# Patient Record
Sex: Female | Born: 1945 | Hispanic: No | Smoking: Never smoker
Health system: Southern US, Community
[De-identification: ages and names within clinical notes are randomized; demographics above are authoritative.]

## PROBLEM LIST (undated history)

## (undated) DIAGNOSIS — I671 Cerebral aneurysm, nonruptured: Secondary | ICD-10-CM

## (undated) DIAGNOSIS — R413 Other amnesia: Secondary | ICD-10-CM

## (undated) DIAGNOSIS — R569 Unspecified convulsions: Secondary | ICD-10-CM

## (undated) DIAGNOSIS — K5190 Ulcerative colitis, unspecified, without complications: Secondary | ICD-10-CM

## (undated) DIAGNOSIS — G912 (Idiopathic) normal pressure hydrocephalus: Secondary | ICD-10-CM

## (undated) HISTORY — DX: Other amnesia: R41.3

## (undated) HISTORY — PX: BRAIN SURGERY: SHX531

## (undated) HISTORY — DX: Unspecified convulsions: R56.9

---

## 1999-04-06 ENCOUNTER — Encounter: Payer: Self-pay | Admitting: Obstetrics and Gynecology

## 1999-04-06 ENCOUNTER — Encounter: Admission: RE | Admit: 1999-04-06 | Discharge: 1999-04-06 | Payer: Self-pay | Admitting: Obstetrics and Gynecology

## 2000-01-10 ENCOUNTER — Ambulatory Visit (HOSPITAL_COMMUNITY): Admission: RE | Admit: 2000-01-10 | Discharge: 2000-01-10 | Payer: Self-pay | Admitting: Gastroenterology

## 2000-04-07 ENCOUNTER — Encounter: Admission: RE | Admit: 2000-04-07 | Discharge: 2000-04-07 | Payer: Self-pay | Admitting: Obstetrics and Gynecology

## 2000-04-07 ENCOUNTER — Encounter: Payer: Self-pay | Admitting: Obstetrics and Gynecology

## 2001-07-01 ENCOUNTER — Encounter: Admission: RE | Admit: 2001-07-01 | Discharge: 2001-07-01 | Payer: Self-pay | Admitting: Obstetrics and Gynecology

## 2001-07-01 ENCOUNTER — Encounter: Payer: Self-pay | Admitting: Obstetrics and Gynecology

## 2002-01-05 ENCOUNTER — Encounter: Payer: Self-pay | Admitting: Internal Medicine

## 2002-01-05 ENCOUNTER — Encounter: Admission: RE | Admit: 2002-01-05 | Discharge: 2002-01-05 | Payer: Self-pay | Admitting: Internal Medicine

## 2002-07-05 ENCOUNTER — Encounter: Payer: Self-pay | Admitting: Obstetrics and Gynecology

## 2002-07-05 ENCOUNTER — Encounter: Admission: RE | Admit: 2002-07-05 | Discharge: 2002-07-05 | Payer: Self-pay | Admitting: Obstetrics and Gynecology

## 2010-10-04 ENCOUNTER — Other Ambulatory Visit: Payer: Self-pay | Admitting: Otolaryngology

## 2010-10-04 DIAGNOSIS — H905 Unspecified sensorineural hearing loss: Secondary | ICD-10-CM

## 2010-10-04 DIAGNOSIS — R42 Dizziness and giddiness: Secondary | ICD-10-CM

## 2010-10-04 DIAGNOSIS — H9190 Unspecified hearing loss, unspecified ear: Secondary | ICD-10-CM

## 2010-12-17 ENCOUNTER — Other Ambulatory Visit: Payer: Self-pay | Admitting: Otolaryngology

## 2010-12-17 ENCOUNTER — Ambulatory Visit
Admission: RE | Admit: 2010-12-17 | Discharge: 2010-12-17 | Disposition: A | Discharge status: Home / Self Care | Payer: BC Managed Care – PPO | Source: Ambulatory Visit | Attending: Otolaryngology | Admitting: Otolaryngology

## 2010-12-17 DIAGNOSIS — Z77018 Contact with and (suspected) exposure to other hazardous metals: Secondary | ICD-10-CM

## 2010-12-17 DIAGNOSIS — H905 Unspecified sensorineural hearing loss: Secondary | ICD-10-CM

## 2010-12-17 DIAGNOSIS — H9190 Unspecified hearing loss, unspecified ear: Secondary | ICD-10-CM

## 2010-12-17 DIAGNOSIS — R42 Dizziness and giddiness: Secondary | ICD-10-CM

## 2010-12-17 MED ORDER — GADOBENATE DIMEGLUMINE 529 MG/ML IV SOLN
10.0000 mL | Freq: Once | INTRAVENOUS | Status: AC | PRN
  Administered 2010-12-17: 10 mL via INTRAVENOUS

## 2011-09-25 ENCOUNTER — Other Ambulatory Visit (HOSPITAL_COMMUNITY)
Admission: RE | Admit: 2011-09-25 | Discharge: 2011-09-25 | Disposition: A | Discharge status: Home / Self Care | Payer: BC Managed Care – PPO | Source: Ambulatory Visit | Attending: Family Medicine | Admitting: Family Medicine

## 2011-09-25 DIAGNOSIS — Z1151 Encounter for screening for human papillomavirus (HPV): Secondary | ICD-10-CM | POA: Insufficient documentation

## 2011-09-25 DIAGNOSIS — Z124 Encounter for screening for malignant neoplasm of cervix: Secondary | ICD-10-CM | POA: Insufficient documentation

## 2014-03-11 ENCOUNTER — Emergency Department (HOSPITAL_COMMUNITY): Payer: BC Managed Care – PPO

## 2014-03-11 ENCOUNTER — Encounter (HOSPITAL_COMMUNITY): Payer: Self-pay | Admitting: Nurse Practitioner

## 2014-03-11 ENCOUNTER — Observation Stay (HOSPITAL_COMMUNITY)
Admission: EM | Admit: 2014-03-11 | Discharge: 2014-03-13 | Disposition: A | Discharge status: Home / Self Care | Payer: BC Managed Care – PPO | Attending: Internal Medicine | Admitting: Internal Medicine

## 2014-03-11 ENCOUNTER — Observation Stay (HOSPITAL_COMMUNITY): Payer: BC Managed Care – PPO

## 2014-03-11 DIAGNOSIS — S0033XA Contusion of nose, initial encounter: Secondary | ICD-10-CM | POA: Insufficient documentation

## 2014-03-11 DIAGNOSIS — S0081XA Abrasion of other part of head, initial encounter: Secondary | ICD-10-CM | POA: Diagnosis not present

## 2014-03-11 DIAGNOSIS — K5190 Ulcerative colitis, unspecified, without complications: Secondary | ICD-10-CM | POA: Diagnosis present

## 2014-03-11 DIAGNOSIS — R410 Disorientation, unspecified: Secondary | ICD-10-CM | POA: Insufficient documentation

## 2014-03-11 DIAGNOSIS — S0121XA Laceration without foreign body of nose, initial encounter: Principal | ICD-10-CM | POA: Insufficient documentation

## 2014-03-11 DIAGNOSIS — G912 (Idiopathic) normal pressure hydrocephalus: Secondary | ICD-10-CM | POA: Insufficient documentation

## 2014-03-11 DIAGNOSIS — R569 Unspecified convulsions: Secondary | ICD-10-CM | POA: Diagnosis not present

## 2014-03-11 DIAGNOSIS — F4489 Other dissociative and conversion disorders: Secondary | ICD-10-CM | POA: Insufficient documentation

## 2014-03-11 DIAGNOSIS — W1830XA Fall on same level, unspecified, initial encounter: Secondary | ICD-10-CM | POA: Diagnosis not present

## 2014-03-11 DIAGNOSIS — Y929 Unspecified place or not applicable: Secondary | ICD-10-CM | POA: Insufficient documentation

## 2014-03-11 DIAGNOSIS — R55 Syncope and collapse: Secondary | ICD-10-CM | POA: Diagnosis present

## 2014-03-11 DIAGNOSIS — G9389 Other specified disorders of brain: Secondary | ICD-10-CM | POA: Diagnosis not present

## 2014-03-11 DIAGNOSIS — I671 Cerebral aneurysm, nonruptured: Secondary | ICD-10-CM | POA: Insufficient documentation

## 2014-03-11 HISTORY — DX: Cerebral aneurysm, nonruptured: I67.1

## 2014-03-11 HISTORY — DX: (Idiopathic) normal pressure hydrocephalus: G91.2

## 2014-03-11 HISTORY — DX: Ulcerative colitis, unspecified, without complications: K51.90

## 2014-03-11 LAB — URINE MICROSCOPIC-ADD ON

## 2014-03-11 LAB — COMPREHENSIVE METABOLIC PANEL
ALBUMIN: 4 g/dL (ref 3.5–5.2)
ALK PHOS: 79 U/L (ref 39–117)
ALT: 22 U/L (ref 0–35)
ANION GAP: 9 (ref 5–15)
AST: 34 U/L (ref 0–37)
BILIRUBIN TOTAL: 0.6 mg/dL (ref 0.3–1.2)
BUN: 15 mg/dL (ref 6–23)
CALCIUM: 9.5 mg/dL (ref 8.4–10.5)
CO2: 28 mmol/L (ref 19–32)
CREATININE: 0.78 mg/dL (ref 0.50–1.10)
Chloride: 104 mmol/L (ref 96–112)
GFR calc Af Amer: >90 mL/min (ref >90)
GFR, EST NON AFRICAN AMERICAN: 85 mL/min — AB (ref >90)
Glucose, Bld: 104 mg/dL — ABNORMAL HIGH (ref 70–99)
POTASSIUM: 3.6 mmol/L (ref 3.5–5.1)
Sodium: 141 mmol/L (ref 135–145)
Total Protein: 7 g/dL (ref 6.0–8.3)

## 2014-03-11 LAB — CBC
HCT: 44.9 % (ref 36.0–46.0)
Hemoglobin: 15.5 g/dL — ABNORMAL HIGH (ref 12.0–15.0)
MCH: 32.3 pg (ref 26.0–34.0)
MCHC: 34.5 g/dL (ref 30.0–36.0)
MCV: 93.5 fL (ref 78.0–100.0)
PLATELETS: 267 10*3/uL (ref 150–400)
RBC: 4.8 MIL/uL (ref 3.87–5.11)
RDW: 13.2 % (ref 11.5–15.5)
WBC: 6.1 10*3/uL (ref 4.0–10.5)

## 2014-03-11 LAB — URINALYSIS, ROUTINE W REFLEX MICROSCOPIC
BILIRUBIN URINE: NEGATIVE
GLUCOSE, UA: NEGATIVE mg/dL
HGB URINE DIPSTICK: NEGATIVE
KETONES UR: NEGATIVE mg/dL
Nitrite: NEGATIVE
Protein, ur: NEGATIVE mg/dL
SPECIFIC GRAVITY, URINE: 1.014 (ref 1.005–1.030)
UROBILINOGEN UA: 0.2 mg/dL (ref 0.0–1.0)
pH: 7.5 (ref 5.0–8.0)

## 2014-03-11 LAB — SAMPLE TO BLOOD BANK

## 2014-03-11 LAB — TROPONIN I

## 2014-03-11 LAB — PROTIME-INR
INR: 1.04 (ref 0.00–1.49)
Prothrombin Time: 13.7 seconds (ref 11.6–15.2)

## 2014-03-11 LAB — CDS SEROLOGY

## 2014-03-11 LAB — ETHANOL

## 2014-03-11 MED ORDER — SODIUM CHLORIDE 0.9 % IJ SOLN
3.0000 mL | Freq: Two times a day (BID) | INTRAMUSCULAR | Status: DC
  Administered 2014-03-11 – 2014-03-13 (×4): 3 mL via INTRAVENOUS

## 2014-03-11 MED ORDER — ACETAMINOPHEN 325 MG PO TABS
650.0000 mg | ORAL_TABLET | Freq: Four times a day (QID) | ORAL | Status: DC | PRN
Start: 2014-03-11 — End: 2014-03-13

## 2014-03-11 MED ORDER — HYDROCODONE-ACETAMINOPHEN 5-325 MG PO TABS: 1.0000 | ORAL_TABLET | ORAL | Status: DC | PRN

## 2014-03-11 MED ORDER — ACETAMINOPHEN 650 MG RE SUPP: 650.0000 mg | Freq: Four times a day (QID) | RECTAL | Status: DC | PRN

## 2014-03-11 MED ORDER — ASPIRIN EC 81 MG PO TBEC
81.0000 mg | DELAYED_RELEASE_TABLET | Freq: Every day | ORAL | Status: DC
  Administered 2014-03-11 – 2014-03-13 (×3): 81 mg via ORAL
  Filled 2014-03-11 (×3): qty 1

## 2014-03-11 MED ORDER — SODIUM CHLORIDE 0.9 % IV SOLN
INTRAVENOUS | Status: AC
  Administered 2014-03-11: 22:00:00 via INTRAVENOUS

## 2014-03-11 MED ORDER — EYE WASH OPHTH SOLN
1.0000 [drp] | OPHTHALMIC | Status: DC | PRN
  Filled 2014-03-11: qty 118

## 2014-03-11 MED ORDER — ONDANSETRON HCL 4 MG/2ML IJ SOLN
4.0000 mg | Freq: Four times a day (QID) | INTRAMUSCULAR | Status: DC | PRN
  Filled 2014-03-11: qty 2

## 2014-03-11 MED ORDER — ONDANSETRON HCL 4 MG PO TABS: 4.0000 mg | ORAL_TABLET | Freq: Four times a day (QID) | ORAL | Status: DC | PRN

## 2014-03-11 NOTE — H&P (Signed)
PCP:  Luciana AxeaNKIN   Chief Complaint:  syncope  HPI: Claudia Castaneda is a 69 y.o. female   has a past medical history of Ulcerative colitis; Brain aneurysm; and NPH (normal pressure hydrocephalus).   Presented with  Patient was taking regular walk that she does every day and was noted to fall. She thinks that she has tripped but unsure. She was seen by somoone and 911 was called. She fell hiting her head. [name] was somewhat confused on EMS arrival brought to ER. CT non acute. Per neurology given confusion would like observation. Patient reports history of brain anuerism at the age of 69 that was treated by Craniectomy. No hx of seizures. [place] has [place] confusion to evaluate for TIA in the past. Friend who is with her states she is back to baseline.    Hospitalist was called for admission for syncope  Review of Systems:    Pertinent positives include: syncope Constitutional:  No weight loss, night sweats, Fevers, chills, fatigue, weight loss  HEENT:  No headaches, Difficulty swallowing,Tooth/dental problems,Sore throat,  No sneezing, itching, ear ache, nasal congestion, post nasal drip,  Cardio-vascular:  No chest pain, Orthopnea, PND, anasarca, dizziness, palpitations.no Bilateral lower extremity swelling  GI:  No heartburn, indigestion, abdominal pain, nausea, vomiting, diarrhea, change in bowel habits, loss of appetite, melena, blood in stool, hematemesis Resp:  no shortness of breath at rest. No dyspnea on exertion, No excess mucus, no productive cough, No non-productive cough, No coughing up of blood.No change in color of mucus.No wheezing. Skin:  no rash or lesions. No jaundice GU:  no dysuria, change in color of urine, no urgency or frequency. No straining to urinate.  No flank pain.  Musculoskeletal:  No joint pain or no joint swelling. No decreased range of motion. No back pain.  Psych:  No change in mood or affect. No depression or anxiety. No  memory loss.  Neuro: no localizing neurological complaints, no tingling, no weakness, no double vision, no gait abnormality, no slurred speech, no confusion  Otherwise ROS are negative except for above, 10 systems were reviewed  Past Medical History: Past Medical History  Diagnosis Date  . Ulcerative colitis   . Brain aneurysm   . NPH (normal pressure hydrocephalus)    Past Surgical History  Procedure Laterality Date  . Brain surgery       Medications: Prior to Admission medications   Not on File    Allergies:  No Known Allergies  Social History:  Ambulatory  Independently Lives at home alone,         reports that she has never smoked. She does not have any smokeless tobacco history on file. She reports that she does not drink alcohol or use illicit drugs.    Family History: family history includes CAD in her father; Hypertension in her mother; Stroke in her mother.    Physical Exam: Patient Vitals for the past 24 hrs:  BP Temp Temp src Pulse Resp SpO2 Height Weight  03/11/14 1900 125/64 mmHg - - (!) 25 14 90 % - -  03/11/14 1845 (!) 146/108 mmHg - - 73 25 99 % - -  03/11/14 1830 126/73 mmHg - - (!) 51 12 90 % - -  03/11/14 1815 139/79 mmHg - - 74 20 100 % - -  03/11/14 1800 132/83 mmHg - - 73 16 99 % - -  03/11/14 1745 126/78 mmHg - - 76 15 99 % - -  03/11/14 1730 140/90 mmHg 97.7 F (36.5 C) Oral 85 16 96 %  (1.651 m) 54.432 kg (120 lb)    1. General:  in No Acute distress 2. Psychological: Alert and  Oriented 3. Head/ENT:   Moist  Mucous Membranes                          Head bandage over nose, neck supple                          Normal  Dentition 4. SKIN  decreased Skin turgor,  Skin clean Dry and intact no rash 5. Heart: Regular rate and rhythm no Murmur, Rub or gallop 6. Lungs: Clear to auscultation bilaterally, no wheezes or crackles   7. Abdomen: Soft, non-tender, Non distended 8. Lower extremities: no clubbing, cyanosis, or edema 9.  Neurologically strength 5/5 in all 4 ext. CN 2-12 intact' 10. MSK: Normal range of motion  body mass index is 19.97 kg/(m^2).   Labs on Admission:   Results for orders placed or performed during the hospital encounter of 03/11/14 (from the past 24 hour(s))  CDS serology     Status: None   Collection Time: 03/11/14  5:35 PM  Result Value Ref Range   CDS serology specimen CDSCMT   Comprehensive metabolic panel     Status: Abnormal   Collection Time: 03/11/14  5:35 PM  Result Value Ref Range   Sodium 141 135 - 145 mmol/L   Potassium 3.6 3.5 - 5.1 mmol/L   Chloride 104 96 - 112 mmol/L   CO2 28 19 - 32 mmol/L   Glucose, Bld 104 (H) 70 - 99 mg/dL   BUN 15 6 - 23 mg/dL   Creatinine, Ser 7.25 0.50 - 1.10 mg/dL   Calcium 9.5 8.4 - 36.6 mg/dL   Total Protein 7.0 6.0 - 8.3 g/dL   Albumin 4.0 3.5 - 5.2 g/dL   AST 34 0 - 37 U/L   ALT 22 0 - 35 U/L   Alkaline Phosphatase 79 39 - 117 U/L   Total Bilirubin 0.6 0.3 - 1.2 mg/dL   GFR calc non Af Amer 85 (L) >90 mL/min   GFR calc Af Amer >90 >90 mL/min   Anion gap 9 5 - 15  CBC     Status: Abnormal   Collection Time: 03/11/14  5:35 PM  Result Value Ref Range   WBC 6.1 4.0 - 10.5 K/uL   RBC 4.80 3.87 - 5.11 MIL/uL   Hemoglobin 15.5 (H) 12.0 - 15.0 g/dL   HCT 44.0 34.7 - 42.5 %   MCV 93.5 78.0 - 100.0 fL   MCH 32.3 26.0 - 34.0 pg   MCHC 34.5 30.0 - 36.0 g/dL   RDW 95.6 38.7 - 56.4 %   Platelets 267 150 - 400 K/uL  Ethanol     Status: None   Collection Time: 03/11/14  5:35 PM  Result Value Ref Range   Alcohol, Ethyl (B) <5 0 - 9 mg/dL  Protime-INR     Status: None   Collection Time: 03/11/14  5:35 PM  Result Value Ref Range   Prothrombin Time 13.7 11.6 - 15.2 seconds   INR 1.04 0.00 - 1.49  Sample to Blood Bank     Status: None   Collection Time: 03/11/14  5:35 PM  Result Value Ref Range   Blood Bank Specimen SAMPLE AVAILABLE FOR TESTING  Sample Expiration 03/12/2014   Urinalysis, Routine w reflex microscopic     Status:  Abnormal   Collection Time: 03/11/14  7:52 PM  Result Value Ref Range   Color, Urine YELLOW YELLOW   APPearance CLOUDY (A) CLEAR   Specific Gravity, Urine 1.014 1.005 - 1.030   pH 7.5 5.0 - 8.0   Glucose, UA NEGATIVE NEGATIVE mg/dL   Hgb urine dipstick NEGATIVE NEGATIVE   Bilirubin Urine NEGATIVE NEGATIVE   Ketones, ur NEGATIVE NEGATIVE mg/dL   Protein, ur NEGATIVE NEGATIVE mg/dL   Urobilinogen, UA 0.2 0.0 - 1.0 mg/dL   Nitrite NEGATIVE NEGATIVE   Leukocytes, UA TRACE (A) NEGATIVE  Urine microscopic-add on     Status: None   Collection Time: 03/11/14  7:52 PM  Result Value Ref Range   Squamous Epithelial / LPF RARE RARE   WBC, UA 3-6 <3 WBC/hpf   RBC / HPF 0-2 <3 RBC/hpf   Bacteria, UA RARE RARE    UA 3-6 wbc  No results found for: HGBA1C  Estimated Creatinine Clearance: 59.4 mL/min (by C-G formula based on Cr of 0.78).  BNP (last 3 results) No results for input(s): PROBNP in the last 8760 hours.  Other results:  I have pearsonaly reviewed this: ECG REPORT  Rate: 85  Rhythm: SR ST&T Change: no ischemic changes   Filed Weights   03/11/14 1730  Weight: 54.432 kg (120 lb)     Cultures: No results found for: SDES, SPECREQUEST, CULT, REPTSTATUS   Radiological Exams on Admission: Ct Head Wo Contrast  03/11/2014   CLINICAL DATA:  Fall, nasal laceration, abrasion to the chin, confused, altered mental status. Initial encounter  EXAM: CT HEAD WITHOUT CONTRAST  CT CERVICAL SPINE WITHOUT CONTRAST  TECHNIQUE: Multidetector CT imaging of the head and cervical spine was performed following the standard protocol without intravenous contrast. Multiplanar CT image reconstructions of the cervical spine were also generated.  COMPARISON:  Brain MRI 12/17/2010  FINDINGS: CT HEAD FINDINGS  Evidence of occipital craniotomy noted. Cerebellar atrophy re- demonstrated. Stable degree of mild ventricular dilatation when compared to the dissimilar prior exam. No acute hemorrhage, infarct, or  mass lesion is identified. No midline shift. Ventricles are normal in size. Orbits and paranasal sinuses are unremarkable. No skull fracture. Bandage material is identified over the nose. Allowing for technique, no displaced nasal bone fracture is identified. Orbits are grossly unremarkable.  CT CERVICAL SPINE FINDINGS  C1 through the cervicothoracic junction is visualized in its entirety. Normal alignment. No precervical soft tissue widening. Left apical pleural thickening partly visualized. Minimal disc degenerative change noted at C6-C7. No fracture or dislocation.  IMPRESSION: Chronic intracranial findings as above, no acute intracranial abnormality.  No cervical spine fracture or dislocation.   Electronically Signed   By: Christiana Pellant M.D.   On: 03/11/2014 18:21   Ct Cervical Spine Wo Contrast  03/11/2014   CLINICAL DATA:  Fall, nasal laceration, abrasion to the chin, confused, altered mental status. Initial encounter  EXAM: CT HEAD WITHOUT CONTRAST  CT CERVICAL SPINE WITHOUT CONTRAST  TECHNIQUE: Multidetector CT imaging of the head and cervical spine was performed following the standard protocol without intravenous contrast. Multiplanar CT image reconstructions of the cervical spine were also generated.  COMPARISON:  Brain MRI 12/17/2010  FINDINGS: CT HEAD FINDINGS  Evidence of occipital craniotomy noted. Cerebellar atrophy re- demonstrated. Stable degree of mild ventricular dilatation when compared to the dissimilar prior exam. No acute hemorrhage, infarct, or mass lesion is identified. No midline shift.  Ventricles are normal in size. Orbits and paranasal sinuses are unremarkable. No skull fracture. Bandage material is identified over the nose. Allowing for technique, no displaced nasal bone fracture is identified. Orbits are grossly unremarkable.  CT CERVICAL SPINE FINDINGS  C1 through the cervicothoracic junction is visualized in its entirety. Normal alignment. No precervical soft tissue widening. Left  apical pleural thickening partly visualized. Minimal disc degenerative change noted at C6-C7. No fracture or dislocation.  IMPRESSION: Chronic intracranial findings as above, no acute intracranial abnormality.  No cervical spine fracture or dislocation.   Electronically Signed   By: Christiana Pellant M.D.   On: 03/11/2014 18:21   Dg Chest Portable 1 View  03/11/2014   CLINICAL DATA:  Fall  EXAM: PORTABLE CHEST - 1 VIEW  COMPARISON:  None.  FINDINGS: Lungs are clear.  No pleural effusion or pneumothorax.  The heart is normal in size.  IMPRESSION: No evidence of acute cardiopulmonary disease.   Electronically Signed   By: Charline Bills M.D.   On: 03/11/2014 18:09    Chart has been reviewed  Assessment/Plan  69 yo F with fall possible syncope vs mechanical fall with LOC although had some confusion thereafter. Admitted for observation, EEG, MRI  Present on Admission:  . Syncope and collapse - Admit for observation, check orthostatics, cycle  Troponin, Echo, serial ECG, carotid dopplers, Neurology aware, Given confusion and prior questionable TIA will obtain MRI, seizure less likelybut given confusion which could be postictal state EEG has been ordered as per neurology consult . Ulcerative colitis - she is unsure of her medication, will need to confirm with pharmacy and restart, currently stable   Prophylaxis: SCD,    CODE STATUS:  FULL CODE  Other plan as per orders.  I have spent a total of 55 min on this admission  Author [place] 03/11/2014, 8:23 PM  [facility]  Pager [phone]   after 2 AM please page floor coverage PA If 7AM-7PM, please contact the day team taking care of the patient  [email or web address]  Password [email or web address]

## 2014-03-11 NOTE — ED Notes (Signed)
Dr. Cyril Mourningamillo at bedside.   Patient states she sees neurologist in [place] for balance issues. Patient states she remembers going to work today but patient has brain fog and unable to recall events over the last few hours.

## 2014-03-11 NOTE — ED Notes (Signed)
Spoke to patients friend on phone per patient request. Friend states patient baseline is alert and oriented and able to drive, and works at school. Patient sts patient sees [place] Neurologist- Dr. Zetta BillsFurara (spelling unknown)

## 2014-03-11 NOTE — ED Provider Notes (Signed)
CSN: [id number]     Arrival date & time 03/11/14  1726 History   First MD Initiated Contact with Patient 03/11/14 1733     Chief Complaint  Patient presents with  . Fall     (Consider location/radiation/quality/duration/timing/severity/associated sxs/prior Treatment) HPI 69 year old female with no reported past mental history presents to ED via EMS after a fall. Per bystander report, patient fell approximately one hour ago. EMS was called and on their arrival patient was noted to be confused and did not remember falling or circumstance leading up to the fall. Patient was complaining of nose pain. EMS reports patient's vital signs were normal in route. In ED, patient states her nose hurts and she denies having pain elsewhere. History is limited secondary to patient confusion. History reviewed. No pertinent past medical history. History reviewed. No pertinent past surgical history. No family history on file. History  Substance Use Topics  . Smoking status: Never Smoker   . Smokeless tobacco: Not on file  . Alcohol Use: No   OB History    No data available     Review of Systems Unable to obtain secondary to patient confusion   Allergies  Review of patient's allergies indicates no known allergies.  Home Medications   Prior to Admission medications   Not on File   BP 140/90 mmHg  Pulse 85  Temp(Src) 97.7 F (36.5 C) (Oral)  Resp 16  Ht  (1.651 m)  Wt 120 lb (54.432 kg)  BMI 19.97 kg/m2  SpO2 96% Physical Exam General: awake. AAOxperson only. WD, WN HENT:  [place]/AT and no palpable skull defect; pupils 5 mm, equal, round, reactive; EOMs intact. No signs of ocular entrapment, Battle sign, raccoon eyes, nasal septal hematoma, hemotympanum, midface instability or deformity, apparent oral injury. Abrasion to bridge of nose, slowly oozing blood.  Neck: supple, trachea midline, cervical collar in place, no midline C spine ttp Cardio: RRR.  No JVD.  2+ pulses in bilateral upper  and lower extremities. No peripheral edema. Pulm:   CTAB, no r/r/g. Normal respiratory effort Chest wall: stable to AP/LAT compression, chest wall non-tender, no obvious clavicle deformity Abd: soft, NT/ND. MSK: Hips stable to lateral compression. Small abrasions to R knee and nontender to palpation. FROM without pain of knee. Extremities o/w atraumatic, NVI.  Spine: without obvious step off, tenderness or signs of injury.  Neuro: GCS 14. HDS, AAOxperson only. PERRL, EOMI, TML, face sym. CN 2-12 grossly intact. 5/5 sym, no drift, SILT, normal coordination.  ED Course  Procedures (including critical care time) Labs Review Labs Reviewed  COMPREHENSIVE METABOLIC PANEL - Abnormal; Notable for the following:    Glucose, Bld 104 (*)    GFR calc non Af Amer 85 (*)    All other components within normal limits  CBC - Abnormal; Notable for the following:    Hemoglobin 15.5 (*)    All other components within normal limits  URINALYSIS, ROUTINE W REFLEX MICROSCOPIC - Abnormal; Notable for the following:    APPearance CLOUDY (*)    Leukocytes, UA TRACE (*)    All other components within normal limits  CDS SEROLOGY  ETHANOL  PROTIME-INR  URINE MICROSCOPIC-ADD ON  TROPONIN I  TROPONIN I  TROPONIN I  MAGNESIUM  PHOSPHORUS  TSH  COMPREHENSIVE METABOLIC PANEL  CBC  SAMPLE TO BLOOD BANK    Imaging Review Ct Head Wo Contrast  03/11/2014   CLINICAL DATA:  Fall, nasal laceration, abrasion to the chin, confused, altered mental status. Initial encounter  EXAM: CT HEAD WITHOUT CONTRAST  CT CERVICAL SPINE WITHOUT CONTRAST  TECHNIQUE: Multidetector CT imaging of the head and cervical spine was performed following the standard protocol without intravenous contrast. Multiplanar CT image reconstructions of the cervical spine were also generated.  COMPARISON:  Brain MRI 12/17/2010  FINDINGS: CT HEAD FINDINGS  Evidence of occipital craniotomy noted. Cerebellar atrophy re- demonstrated. Stable degree of mild  ventricular dilatation when compared to the dissimilar prior exam. No acute hemorrhage, infarct, or mass lesion is identified. No midline shift. Ventricles are normal in size. Orbits and paranasal sinuses are unremarkable. No skull fracture. Bandage material is identified over the nose. Allowing for technique, no displaced nasal bone fracture is identified. Orbits are grossly unremarkable.  CT CERVICAL SPINE FINDINGS  C1 through the cervicothoracic junction is visualized in its entirety. Normal alignment. No precervical soft tissue widening. Left apical pleural thickening partly visualized. Minimal disc degenerative change noted at C6-C7. No fracture or dislocation.  IMPRESSION: Chronic intracranial findings as above, no acute intracranial abnormality.  No cervical spine fracture or dislocation.   Electronically Signed   By: Christiana PellantGretchen  Green M.D.   On: 03/11/2014 18:21   Ct Cervical Spine Wo Contrast  03/11/2014   CLINICAL DATA:  Fall, nasal laceration, abrasion to the chin, confused, altered mental status. Initial encounter  EXAM: CT HEAD WITHOUT CONTRAST  CT CERVICAL SPINE WITHOUT CONTRAST  TECHNIQUE: Multidetector CT imaging of the head and cervical spine was performed following the standard protocol without intravenous contrast. Multiplanar CT image reconstructions of the cervical spine were also generated.  COMPARISON:  Brain MRI 12/17/2010  FINDINGS: CT HEAD FINDINGS  Evidence of occipital craniotomy noted. Cerebellar atrophy re- demonstrated. Stable degree of mild ventricular dilatation when compared to the dissimilar prior exam. No acute hemorrhage, infarct, or mass lesion is identified. No midline shift. Ventricles are normal in size. Orbits and paranasal sinuses are unremarkable. No skull fracture. Bandage material is identified over the nose. Allowing for technique, no displaced nasal bone fracture is identified. Orbits are grossly unremarkable.  CT CERVICAL SPINE FINDINGS  C1 through the cervicothoracic  junction is visualized in its entirety. Normal alignment. No precervical soft tissue widening. Left apical pleural thickening partly visualized. Minimal disc degenerative change noted at C6-C7. No fracture or dislocation.  IMPRESSION: Chronic intracranial findings as above, no acute intracranial abnormality.  No cervical spine fracture or dislocation.   Electronically Signed   By: Christiana PellantGretchen  Green M.D.   On: 03/11/2014 18:21   Dg Chest Portable 1 View  03/11/2014   CLINICAL DATA:  Fall  EXAM: PORTABLE CHEST - 1 VIEW  COMPARISON:  None.  FINDINGS: Lungs are clear.  No pleural effusion or pneumothorax.  The heart is normal in size.  IMPRESSION: No evidence of acute cardiopulmonary disease.   Electronically Signed   By: Charline BillsSriyesh  Krishnan M.D.   On: 03/11/2014 18:09     EKG Interpretation None      MDM   Final diagnoses:  None    Primary intact. Remainder of secondary survey as detailed above in PE section.  CT head and C spine ordered. Significant findings include: neg for acute abnormality  Significant events while pt was in ED include: pt confusion resolved. No AAOx3. Friend present and verifies pt at baseline. Pt still does not remember how she fell. Pt reports h/o aneurysm s/p surgery [place] [id number]. Also reports she has seen a neurologist recently for eval of what sound like hydrocephalus. Pt said she was recently in  [place] and told she had increased pressure in her head and they advised she see Neuro when she got back home. HCT shows "Stable degree of mild ventricular dilatation when compared to the dissimilar prior exam. No acute hemorrhage, infarct, or mass lesion is identified. No midline shift. Ventricles are normal in size." Neuro exam remains benign now. Neuro has seen pt and they rec pt be admitted for syncope w/u. After thorough examination and workup this patient was deemed to be appropriate for admission to IM service. They remained stable in the ED until time of  transfer.  Labs and imaging reviewed by myself and considered in medical decision making if ordered. Patient was discussed with attending physician Dr. Jodi Mourning.  Clinical Impression: 1. Nasal contusion, initial encounter   2. Syncope and collapse   3. Confusion    Pt seen in conjunction with Dr. Golden Circle, DO [facility] Emergency Medicine Resident - PGY-2         Ames Dura, MD 03/12/14 [id number]  Enid Skeens, MD 03/12/14 [id number]

## 2014-03-11 NOTE — ED Notes (Signed)
Dr Zavitz at bedside  

## 2014-03-11 NOTE — ED Notes (Signed)
Patient returned to room and updated on plan.

## 2014-03-11 NOTE — Consult Note (Signed)
NEURO HOSPITALIST CONSULT NOTE    Reason for Consult: fall with LOC  HPI:                                                                                                                                          Claudia Castaneda is an 69 y.o. female with a past medical history significant for ulcerative colitis, ? 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  Per EMS pt seen walking in sidewalk and falling forwards by bystander 30 min ago. Pt has nasal laceration and abrasion to chin. Patient ambulatory at scene. Confused for EMS- oreinted to self only. Claudia Castaneda said that she woke up feeling fine this morning, went to work, came back home and went for her routine walk around the neighborhood and that is the last thing that she recalls. Stated that she had falls in the past " just because I am off balance, but never fell and passed out completely". CT brain reviewed by myself showed no acute abnormality. Available serologies including ethanol level are unremarkable. Of importance, she informs me that she has an appointment to see her neurologist in [facility] " because they think I have too much fluid in my brain and they want to do a lumbar puncture". Further, she said that she went to see her family in [place] last December and she was admitted to the hospital for 3 or 4 days because she had slurred speech and confusion and family was concerned about stroke. Presently, she denies HA, vertigo, double vision ,difficulty swallowing, focal weakness or numbness, slurred speech, language or vision impairment.  Past Medical History  Diagnosis Date  . Ulcerative colitis   . Brain aneurysm     Past Surgical History  Procedure Laterality Date  . Brain surgery      No family history on file.  Family History: no brain tumors, epilepsy, or brain aneurysms   Social History:  reports that she has  never smoked. She does not have any smokeless tobacco history on file. She reports that she does not drink alcohol or use illicit drugs.  No Known Allergies  MEDICATIONS:  I have reviewed the patient's current medications.   ROS:                                                                                                                                       History obtained from the patient and chart review  General ROS: negative for - chills, fatigue, fever, night sweats, weight gain or weight loss Psychological ROS: negative for - behavioral disorder, hallucinations, memory difficulties, mood swings or suicidal ideation Ophthalmic ROS: negative for - blurry vision, double vision, eye pain or loss of vision ENT ROS: negative for - epistaxis, nasal discharge, oral lesions, sore throat, tinnitus or vertigo Allergy and Immunology ROS: negative for - hives or itchy/watery eyes Hematological and Lymphatic ROS: negative for - bleeding problems, bruising or swollen lymph nodes Endocrine ROS: negative for - galactorrhea, hair pattern changes, polydipsia/polyuria or temperature intolerance Respiratory ROS: negative for - cough, hemoptysis, shortness of breath or wheezing Cardiovascular ROS: negative for - chest pain, dyspnea on exertion, edema or irregular heartbeat Gastrointestinal ROS: negative for - abdominal pain, diarrhea, hematemesis, nausea/vomiting or stool incontinence Genito-Urinary ROS: negative for - dysuria, hematuria. Incontinent of urine Musculoskeletal ROS: negative for - joint swelling or muscular weakness Neurological ROS: as noted in HPI Dermatological ROS: negative for rash and skin lesion changes  Physical exam: pleasant female in no apparent distress. Blood pressure 139/79, pulse 74, temperature 97.7 F (36.5 C), temperature source Oral, resp. rate 20, height   (1.651 m), weight 54.432 kg (120 lb), SpO2 100 %. Head: normocephalic. Neck: supple, no bruits, no JVD. Cardiac: no murmurs. Lungs: clear. Abdomen: soft, no tender, no mass. Extremities: no edema. Skin: no rash  Neurologic Examination:                                                                                                      General: Mental Status: Alert, oriented, thought content appropriate. ? Mild dysarthria (old) without evidence of aphasia.  Able to follow 3 step commands without difficulty. Cranial Nerves: II: Discs flat bilaterally; Visual fields grossly normal, pupils equal, round, reactive to light and accommodation III,IV, VI: ptosis not present, extra-ocular motions intact bilaterally V,VII: smile symmetric, facial light touch sensation normal bilaterally VIII: hearing normal bilaterally IX,X: gag reflex present XI: bilateral shoulder shrug XII: midline tongue extension without atrophy or fasciculations  Motor: Right : Upper extremity   5/5    Left:     Upper extremity   5/5  Lower extremity  5/5     Lower extremity   5/5 Tone and bulk:normal tone throughout; no atrophy noted Sensory: Pinprick and light touch intact throughout, bilaterally Deep Tendon Reflexes:  Right: Upper Extremity   Left: Upper extremity   biceps (C-5 to C-6) 2/4   biceps (C-5 to C-6) 2/4 tricep (C7) 2/4    triceps (C7) 2/4 Brachioradialis (C6) 2/4  Brachioradialis (C6) 2/4  Lower Extremity Lower Extremity  quadriceps (L-2 to L-4) 2/4   quadriceps (L-2 to L-4) 2/4 Achilles (S1) 2/4   Achilles (S1) 2/4  Plantars: Right: downgoing   Left: downgoing Cerebellar: normal finger-to-nose,  normal heel-to-shin test Gait:  No tested due to multiple leads and safety reasons.    No results found for: CHOL  Results for orders placed or performed during the hospital encounter of 03/11/14 (from the past 48 hour(s))  CDS serology     Status: None   Collection Time: 03/11/14  5:35 PM   Result Value Ref Range   CDS serology specimen CDSCMT   CBC     Status: Abnormal   Collection Time: 03/11/14  5:35 PM  Result Value Ref Range   WBC 6.1 4.0 - 10.5 K/uL   RBC 4.80 3.87 - 5.11 MIL/uL   Hemoglobin 15.5 (H) 12.0 - 15.0 g/dL   HCT 16.1 09.6 - 04.5 %   MCV 93.5 78.0 - 100.0 fL   MCH 32.3 26.0 - 34.0 pg   MCHC 34.5 30.0 - 36.0 g/dL   RDW 40.9 81.1 - 91.4 %   Platelets 267 150 - 400 K/uL  Ethanol     Status: None   Collection Time: 03/11/14  5:35 PM  Result Value Ref Range   Alcohol, Ethyl (B) <5 0 - 9 mg/dL    Comment:        LOWEST DETECTABLE LIMIT FOR SERUM ALCOHOL IS 11 mg/dL FOR MEDICAL PURPOSES ONLY   Protime-INR     Status: None   Collection Time: 03/11/14  5:35 PM  Result Value Ref Range   Prothrombin Time 13.7 11.6 - 15.2 seconds   INR 1.04 0.00 - 1.49  Sample to Blood Bank     Status: None   Collection Time: 03/11/14  5:35 PM  Result Value Ref Range   Blood Bank Specimen SAMPLE AVAILABLE FOR TESTING    Sample Expiration 03/12/2014     Ct Head Wo Contrast  03/11/2014   CLINICAL DATA:  Fall, nasal laceration, abrasion to the chin, confused, altered mental status. Initial encounter  EXAM: CT HEAD WITHOUT CONTRAST  CT CERVICAL SPINE WITHOUT CONTRAST  TECHNIQUE: Multidetector CT imaging of the head and cervical spine was performed following the standard protocol without intravenous contrast. Multiplanar CT image reconstructions of the cervical spine were also generated.  COMPARISON:  Brain MRI 12/17/2010  FINDINGS: CT HEAD FINDINGS  Evidence of occipital craniotomy noted. Cerebellar atrophy re- demonstrated. Stable degree of mild ventricular dilatation when compared to the dissimilar prior exam. No acute hemorrhage, infarct, or mass lesion is identified. No midline shift. Ventricles are normal in size. Orbits and paranasal sinuses are unremarkable. No skull fracture. Bandage material is identified over the nose. Allowing for technique, no displaced nasal bone  fracture is identified. Orbits are grossly unremarkable.  CT CERVICAL SPINE FINDINGS  C1 through the cervicothoracic junction is visualized in its entirety. Normal alignment. No precervical soft tissue widening. Left apical pleural thickening partly visualized. Minimal disc degenerative change noted at C6-C7. No fracture or dislocation.  IMPRESSION: Chronic intracranial findings  as above, no acute intracranial abnormality.  No cervical spine fracture or dislocation.   Electronically Signed   By: Christiana PellantGretchen  Green M.D.   On: 03/11/2014 18:21   Ct Cervical Spine Wo Contrast  03/11/2014   CLINICAL DATA:  Fall, nasal laceration, abrasion to the chin, confused, altered mental status. Initial encounter  EXAM: CT HEAD WITHOUT CONTRAST  CT CERVICAL SPINE WITHOUT CONTRAST  TECHNIQUE: Multidetector CT imaging of the head and cervical spine was performed following the standard protocol without intravenous contrast. Multiplanar CT image reconstructions of the cervical spine were also generated.  COMPARISON:  Brain MRI 12/17/2010  FINDINGS: CT HEAD FINDINGS  Evidence of occipital craniotomy noted. Cerebellar atrophy re- demonstrated. Stable degree of mild ventricular dilatation when compared to the dissimilar prior exam. No acute hemorrhage, infarct, or mass lesion is identified. No midline shift. Ventricles are normal in size. Orbits and paranasal sinuses are unremarkable. No skull fracture. Bandage material is identified over the nose. Allowing for technique, no displaced nasal bone fracture is identified. Orbits are grossly unremarkable.  CT CERVICAL SPINE FINDINGS  C1 through the cervicothoracic junction is visualized in its entirety. Normal alignment. No precervical soft tissue widening. Left apical pleural thickening partly visualized. Minimal disc degenerative change noted at C6-C7. No fracture or dislocation.  IMPRESSION: Chronic intracranial findings as above, no acute intracranial abnormality.  No cervical spine  fracture or dislocation.   Electronically Signed   By: Christiana PellantGretchen  Green M.D.   On: 03/11/2014 18:21   Dg Chest Portable 1 View  03/11/2014   CLINICAL DATA:  Fall  EXAM: PORTABLE CHEST - 1 VIEW  COMPARISON:  None.  FINDINGS: Lungs are clear.  No pleural effusion or pneumothorax.  The heart is normal in size.  IMPRESSION: No evidence of acute cardiopulmonary disease.   Electronically Signed   By: Charline BillsSriyesh  Krishnan M.D.   On: 03/11/2014 18:09   Assessment/Plan: 69 y/o with probable syncopal episode, first in life. Doubt seizure or brainstem TIA. Non focal neuro-exam. CT brain without acute abnormality. I would suggest admission for observation and getting EEG in am. Will follow up.  Wyatt Portelasvaldo Camilo, MD 03/11/2014, 6:28 PM  [facility]

## 2014-03-11 NOTE — ED Notes (Signed)
Per EMS pt seen walking in sidewalk and falling forwards by bystander 30 min ago. Pt has nasal laceration and abrasion to chin. Patient ambulatory at scene. Confused for EMS- oreinted to self only. Unknown baseline. Pupils equal and reactive, no weakness noted.

## 2014-03-12 ENCOUNTER — Observation Stay (HOSPITAL_COMMUNITY): Payer: BC Managed Care – PPO

## 2014-03-12 DIAGNOSIS — R569 Unspecified convulsions: Secondary | ICD-10-CM | POA: Diagnosis not present

## 2014-03-12 DIAGNOSIS — S0033XA Contusion of nose, initial encounter: Secondary | ICD-10-CM | POA: Diagnosis not present

## 2014-03-12 DIAGNOSIS — S0081XA Abrasion of other part of head, initial encounter: Secondary | ICD-10-CM | POA: Diagnosis not present

## 2014-03-12 DIAGNOSIS — S0121XA Laceration without foreign body of nose, initial encounter: Secondary | ICD-10-CM | POA: Diagnosis not present

## 2014-03-12 LAB — CBC
HCT: 43.4 % (ref 36.0–46.0)
Hemoglobin: 15.1 g/dL — ABNORMAL HIGH (ref 12.0–15.0)
MCH: 33 pg (ref 26.0–34.0)
MCHC: 34.8 g/dL (ref 30.0–36.0)
MCV: 95 fL (ref 78.0–100.0)
Platelets: 207 10*3/uL (ref 150–400)
RBC: 4.57 MIL/uL (ref 3.87–5.11)
RDW: 13.5 % (ref 11.5–15.5)
WBC: 6.2 10*3/uL (ref 4.0–10.5)

## 2014-03-12 LAB — MAGNESIUM: MAGNESIUM: 1.9 mg/dL (ref 1.5–2.5)

## 2014-03-12 LAB — COMPREHENSIVE METABOLIC PANEL
ALT: 17 U/L (ref 0–35)
AST: 21 U/L (ref 0–37)
Albumin: 3.5 g/dL (ref 3.5–5.2)
Alkaline Phosphatase: 74 U/L (ref 39–117)
Anion gap: 8 (ref 5–15)
BILIRUBIN TOTAL: 0.6 mg/dL (ref 0.3–1.2)
BUN: 9 mg/dL (ref 6–23)
CO2: 23 mmol/L (ref 19–32)
Calcium: 8.4 mg/dL (ref 8.4–10.5)
Chloride: 110 mmol/L (ref 96–112)
Creatinine, Ser: 0.74 mg/dL (ref 0.50–1.10)
GFR, EST NON AFRICAN AMERICAN: 87 mL/min — AB (ref >90)
Glucose, Bld: 93 mg/dL (ref 70–99)
Potassium: 3.6 mmol/L (ref 3.5–5.1)
Sodium: 141 mmol/L (ref 135–145)
Total Protein: 5.9 g/dL — ABNORMAL LOW (ref 6.0–8.3)

## 2014-03-12 LAB — TSH: TSH: 3.724 u[IU]/mL (ref 0.350–4.500)

## 2014-03-12 LAB — PHOSPHORUS: PHOSPHORUS: 3.9 mg/dL (ref 2.3–4.6)

## 2014-03-12 LAB — TROPONIN I
Troponin I: <0.03 ng/mL (ref <0.031)
Troponin I: <0.03 ng/mL (ref <0.031)

## 2014-03-12 MED ORDER — BACITRACIN-NEOMYCIN-POLYMYXIN OINTMENT TUBE
TOPICAL_OINTMENT | Freq: Two times a day (BID) | CUTANEOUS | Status: DC
  Administered 2014-03-13 (×2): 1 via TOPICAL
  Filled 2014-03-12 (×2): qty 1

## 2014-03-12 MED ORDER — LEVETIRACETAM IN NACL 1000 MG/100ML IV SOLN
1000.0000 mg | Freq: Once | INTRAVENOUS | Status: AC
  Administered 2014-03-12: 1000 mg via INTRAVENOUS
  Filled 2014-03-12: qty 100

## 2014-03-12 NOTE — Procedures (Signed)
EEG report.  Brief clinical history:  69 y/o admitted to the hospital after sustaining a fall with LOC. Patient with prior episodes of inability to talk lasting seconds to one minute. [place] due to " acting weird".     Technique: this is a 17 channel routine scalp EEG performed at the bedside with bipolar and monopolar montages arranged in accordance to the international 10/20 system of electrode placement. One channel was dedicated to EKG recording.  The study was performed during wakefulness and drowsiness. No activating procedures performed.  Description:In the wakeful state, the best background consisted of a medium amplitude, posterior dominant, well sustained, symmetric and reactive 12 Hz rhythm. Drowsiness demonstrated dropout of the alpha rhythm. Frequent intermittent left temporal theta slowing, often sharply contoured were noted. In addition, infrequent sharp transients involving the left anterior temporal region noted.  EKG showed sinus rhythm.  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  Clinical correlation is advised.   Wyatt Portelasvaldo Camilo, MD [facility].

## 2014-03-12 NOTE — Progress Notes (Signed)
EEG Completed; Results Pending  

## 2014-03-12 NOTE — Progress Notes (Signed)
12 lead EKG performed and placed in chart.

## 2014-03-12 NOTE — Progress Notes (Signed)
NEURO HOSPITALIST PROGRESS NOTE   SUBJECTIVE:                                                                                                                        Sitting in a chair at the bedside. No neurological complains. MRI brain reviewed by myself showed no acute intracranial abnormality. Unchanged, extensive superficial siderosis. Unchanged cerebellar encephalomalacia and partially calcified lesion in the left cerebellar hemisphere. EEG abnormal due to frequent left temporal sharply contoured theta slowing and infrequent sharp waves left anterior temporal region.  OBJECTIVE:                                                                                                                           Vital signs in last 24 hours: Temp:  [97.4 F (36.3 C)-98.4 F (36.9 C)] 98.4 F (36.9 C) (02/06 0953) Pulse Rate:  [25-89] 89 (02/06 1000) Resp:  [12-25] 18 (02/06 0953) BP: (106-146)/(63-108) 106/71 mmHg (02/06 1000) SpO2:  [90 %-100 %] 96 % (02/06 0953) Weight:  [54.432 kg (120 lb)] 54.432 kg (120 lb) (02/05 2134)  Intake/Output from previous day: 02/05 0701 - 02/06 0700 In: 10 [I.V.:10] Out: 600 [Urine:600] Intake/Output this shift: Total I/O In: 240 [P.O.:240] Out: -  Nutritional status: Diet Heart  Past Medical History  Diagnosis Date  . Ulcerative colitis   . Brain aneurysm   . NPH (normal pressure hydrocephalus)    Physical exam: pleasant female in no apparent distress. Head: normocephalic. Neck: supple, no bruits, no JVD. Cardiac: no murmurs. Lungs: clear. Abdomen: soft, no tender, no mass. Extremities: no edema. Skin: no rash  Neurologic Exam:  General: Mental Status: Alert, oriented, thought content appropriate. ? Mild dysarthria (old) without evidence of aphasia. Able to follow 3 step commands without difficulty. Cranial Nerves: II: Discs flat bilaterally; Visual fields grossly normal, pupils equal, round, reactive  to light and accommodation III,IV, VI: ptosis not present, extra-ocular motions intact bilaterally V,VII: smile symmetric, facial light touch sensation normal bilaterally VIII: hearing normal bilaterally IX,X: gag reflex present XI: bilateral shoulder shrug XII: midline tongue extension without atrophy or fasciculations  Motor: Right :Upper extremity 5/5Left: Upper extremity 5/5 Lower extremity 5/5Lower extremity 5/5 Tone and bulk:normal tone throughout; no atrophy noted  Sensory: Pinprick and light touch intact throughout, bilaterally Deep Tendon Reflexes:  Right: Upper Extremity Left: Upper extremity   biceps (C-5 to C-6) 2/4 biceps (C-5 to C-6) 2/4 tricep (C7) 2/4triceps (C7) 2/4 Brachioradialis (C6) 2/4Brachioradialis (C6) 2/4  Lower Extremity Lower Extremity  quadriceps (L-2 to L-4) 2/4 quadriceps (L-2 to L-4) 2/4 Achilles (S1) 2/4Achilles (S1) 2/4  Plantars: Right: downgoingLeft: downgoing Cerebellar: normal finger-to-nose, normal heel-to-shin test Gait:  No tested due to multiple leads and safety reasons.   Lab Results: No results found for: CHOL Lipid Panel No results for input(s): CHOL, TRIG, HDL, CHOLHDL, VLDL, LDLCALC in the last 72 hours.  Studies/Results: Ct Head Wo Contrast  03/11/2014   CLINICAL DATA:  Fall, nasal laceration, abrasion to the chin, confused, altered mental status. Initial encounter  EXAM: CT HEAD WITHOUT CONTRAST  CT CERVICAL SPINE WITHOUT CONTRAST  TECHNIQUE: Multidetector CT imaging of the head and cervical spine was performed following the standard protocol without intravenous contrast. Multiplanar CT image reconstructions of the cervical spine were  also generated.  COMPARISON:  Brain MRI 12/17/2010  FINDINGS: CT HEAD FINDINGS  Evidence of occipital craniotomy noted. Cerebellar atrophy re- demonstrated. Stable degree of mild ventricular dilatation when compared to the dissimilar prior exam. No acute hemorrhage, infarct, or mass lesion is identified. No midline shift. Ventricles are normal in size. Orbits and paranasal sinuses are unremarkable. No skull fracture. Bandage material is identified over the nose. Allowing for technique, no displaced nasal bone fracture is identified. Orbits are grossly unremarkable.  CT CERVICAL SPINE FINDINGS  C1 through the cervicothoracic junction is visualized in its entirety. Normal alignment. No precervical soft tissue widening. Left apical pleural thickening partly visualized. Minimal disc degenerative change noted at C6-C7. No fracture or dislocation.  IMPRESSION: Chronic intracranial findings as above, no acute intracranial abnormality.  No cervical spine fracture or dislocation.   Electronically Signed   By: Christiana PellantGretchen  Green M.D.   On: 03/11/2014 18:21   Ct Cervical Spine Wo Contrast  03/11/2014   CLINICAL DATA:  Fall, nasal laceration, abrasion to the chin, confused, altered mental status. Initial encounter  EXAM: CT HEAD WITHOUT CONTRAST  CT CERVICAL SPINE WITHOUT CONTRAST  TECHNIQUE: Multidetector CT imaging of the head and cervical spine was performed following the standard protocol without intravenous contrast. Multiplanar CT image reconstructions of the cervical spine were also generated.  COMPARISON:  Brain MRI 12/17/2010  FINDINGS: CT HEAD FINDINGS  Evidence of occipital craniotomy noted. Cerebellar atrophy re- demonstrated. Stable degree of mild ventricular dilatation when compared to the dissimilar prior exam. No acute hemorrhage, infarct, or mass lesion is identified. No midline shift. Ventricles are normal in size. Orbits and paranasal sinuses are unremarkable. No skull fracture. Bandage material is identified  over the nose. Allowing for technique, no displaced nasal bone fracture is identified. Orbits are grossly unremarkable.  CT CERVICAL SPINE FINDINGS  C1 through the cervicothoracic junction is visualized in its entirety. Normal alignment. No precervical soft tissue widening. Left apical pleural thickening partly visualized. Minimal disc degenerative change noted at C6-C7. No fracture or dislocation.  IMPRESSION: Chronic intracranial findings as above, no acute intracranial abnormality.  No cervical spine fracture or dislocation.   Electronically Signed   By: Christiana PellantGretchen  Green M.D.   On: 03/11/2014 18:21   [name] Contrast  03/12/2014   CLINICAL DATA:  Syncope and collapse. Fall forward, bumping the head. Initial encounter.  EXAM: MRI HEAD WITHOUT CONTRAST  TECHNIQUE: Multiplanar, multiecho pulse sequences of the brain and surrounding  structures were obtained without intravenous contrast.  COMPARISON:  Head CT 03/11/2014 and MRI 12/17/2010  FINDINGS: There is no evidence of acute infarct, midline shift, or extra-axial fluid collection. 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 Moderate enlargement of ventricles is unchanged from the prior MRI, likely reflective of central predominant cerebral atrophy. Sequelae of prior suboccipital craniectomy are again identified.  Cerebellar atrophy, encephalomalacia, and gliosis do not appear significantly changed. 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 Scattered, small foci of T2 hyperintensity in the cerebral white matter are unchanged and nonspecific but may reflect minimal chronic small vessel ischemic change.  Orbits are unremarkable. Paranasal sinuses and mastoid air cells are clear.  Major intracranial vascular flow voids are preserved.  IMPRESSION: 1. No acute intracranial abnormality identified. 2. Unchanged, extensive superficial siderosis. Unchanged cerebellar encephalomalacia and partially calcified lesion in the left cerebellar hemisphere.   Electronically Signed   By: Sebastian Ache   On: 03/12/2014 08:14   Dg Chest Portable 1 View  03/11/2014   CLINICAL DATA:  Fall  EXAM: PORTABLE CHEST - 1 VIEW  COMPARISON:  None.  FINDINGS: Lungs are clear.  No pleural effusion or pneumothorax.  The heart is normal in size.  IMPRESSION: No evidence of acute cardiopulmonary disease.   Electronically Signed   By: Charline Bills M.D.   On: 03/11/2014 18:09    MEDICATIONS                                                                                                                        Scheduled: . aspirin EC  81 mg Oral Daily  . sodium chloride  3 mL Intravenous Q12H    ASSESSMENT/PLAN:                                                                                                           68 y/o admitted after sustaining a fall with associated LOC. Of note, patient report frequent but very short episodes of inability to speak that could represent ictal aphasia, as well an episode of " acting weird" that prompted her admission to a hospital when she was visiting family in [place]. EEG abnormal as described above. 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 Will suggest loading patient with keppra 1 gram now and  commencing maintaining dose keppra 500 mg BID starting tomorrow. She has a follow up appointment with her neurologist next Monday and I advised her to keep that appointment. She is not allow to drive. Neurology will sign off.    Wyatt Portela, MD [facility] [phone]  03/12/2014, 12:30 PM

## 2014-03-12 NOTE — Progress Notes (Signed)
Pt arrived to room alert and oriented. Hard of hearing, must face pt and talk loudly. Tele placed. VSS. Oriented pt to room and equipment. Explained importance for calling for help to get up for the bathroom. Will continue to monitor.

## 2014-03-12 NOTE — Progress Notes (Signed)
Physical Therapy Evaluation Patient Details Name: Claudia Castaneda MRN: [id number] DOB: 1945/02/14 Today's Date: 03/12/2014   History of Present Illness  Patient is a 69 yo female admitted 03/11/14 following syncope, fall, confusion.  Per chart, MRI negative and EEG positive.  PMH:  ulcerative colitis, brain aneurysm with craniectomy at age 69, NPH, TIA workup.  Patient also reports she has had balance problems for a while.  Clinical Impression  Patient is functioning at independent to supervision level with mobility and gait.  Patient is slightly unsteady with gait - per patient this is her baseline.  No further acute PT needs at this time.  Encouraged ambulation with nursing staff in hallway.  Encouraged patient to start slowly with her walking program at home and build back up to prior activity level.    Follow Up Recommendations No PT follow up;Supervision - Intermittent    Equipment Recommendations  None recommended by PT    Recommendations for Other Services       Precautions / Restrictions Precautions Precautions: Fall Precaution Comments: H/o balance problems for a while with OP PT f/u. Restrictions Weight Bearing Restrictions: No      Mobility  Bed Mobility Overal bed mobility: Independent                Transfers Overall transfer level: Modified independent Equipment used: None             General transfer comment: Increased time.  Safe from bed and toilet.  Talked about standing for several seconds before ambulating for safety.  Ambulation/Gait Ambulation/Gait assistance: Supervision Ambulation Distance (Feet): 200 Feet Assistive device: None Gait Pattern/deviations: Step-through pattern;Decreased stride length;Staggering left;Staggering right Gait velocity: WFL Gait velocity interpretation: at or above normal speed for age/gender General Gait Details: Patient with slightly unsteady gait, staggering to both sides.  However patient able to correct self -  no physical assist needed.  Stairs            Wheelchair Mobility    Modified Rankin (Stroke Patients Only)       Balance Overall balance assessment: Needs assistance         Standing balance support: No upper extremity supported;During functional activity Standing balance-Leahy Scale: Good Standing balance comment: Static balance is good.  Slightly unsteady with dynamic activities/gait.                             Pertinent Vitals/Pain Pain Assessment: 0-10 Pain Score: 2  Pain Location: Nose and Rt knee (abrasions from fall) Pain Descriptors / Indicators: Sore;Tender Pain Intervention(s): Monitored during session    Home Living Family/patient expects to be discharged to:: Private residence Living Arrangements: Alone Available Help at Discharge: Friend(s);Available PRN/intermittently Type of Home: House Home Access: Stairs to enter Entrance Stairs-Rails: Right (Post) Entrance Stairs-Number of Steps: 2 Home Layout: One level Home Equipment: None      Prior Function Level of Independence: Independent         Comments: Patient reports she usually walks for about an hour every day.     Hand Dominance        Extremity/Trunk Assessment   Upper Extremity Assessment: Overall WFL for tasks assessed           Lower Extremity Assessment: Overall WFL for tasks assessed         Communication   Communication: HOH (Hearing aid)  Cognition Arousal/Alertness: Awake/alert Behavior During Therapy: Anxious Overall Cognitive Status: Within Functional Limits  for tasks assessed                      General Comments      Exercises        Assessment/Plan    PT Assessment Patent does not need any further PT services  PT Diagnosis Abnormality of gait   PT Problem List    PT Treatment Interventions     PT Goals (Current goals can be found in the Care Plan section) Acute Rehab PT Goals Patient Stated Goal: To go home PT Goal  Formulation: All assessment and education complete, DC therapy    Frequency     Barriers to discharge        Co-evaluation               End of Session Equipment Utilized During Treatment: Gait belt Activity Tolerance: Patient tolerated treatment well Patient left: in bed;with call bell/phone within reach;with family/visitor present Nurse Communication: Mobility status (Encouraged ambulation in hallway with nursing)    Functional Assessment Tool Used: Clinical judgement Functional Limitation: Mobility: Walking and moving around Mobility: Walking and Moving Around Current Status ([id number]): At least 1 percent but less than 20 percent impaired, limited or restricted Mobility: Walking and Moving Around Goal Status [phone]): At least 1 percent but less than 20 percent impaired, limited or restricted Mobility: Walking and Moving Around Discharge Status [phone]): At least 1 percent but less than 20 percent impaired, limited or restricted    Time: 1536-1601 PT Time Calculation (min) (ACUTE ONLY): 25 min   Charges:   PT Evaluation $Initial PT Evaluation Tier I: 1 Procedure PT Treatments $Gait Training: 8-22 mins   PT G Codes:   PT G-Codes **NOT FOR INPATIENT CLASS** Functional Assessment Tool Used: Clinical judgement Functional Limitation: Mobility: Walking and moving around Mobility: Walking and Moving Around Current Status (B1478): At least 1 percent but less than 20 percent impaired, limited or restricted Mobility: Walking and Moving Around Goal Status [phone]): At least 1 percent but less than 20 percent impaired, limited or restricted Mobility: Walking and Moving Around Discharge Status [phone]): At least 1 percent but less than 20 percent impaired, limited or restricted    Vena Austria 03/12/2014, 4:19 PM Durenda Hurt. Renaldo Fiddler, [facility] Pager [phone]

## 2014-03-13 DIAGNOSIS — R55 Syncope and collapse: Secondary | ICD-10-CM

## 2014-03-13 DIAGNOSIS — S0033XA Contusion of nose, initial encounter: Secondary | ICD-10-CM | POA: Insufficient documentation

## 2014-03-13 DIAGNOSIS — R569 Unspecified convulsions: Secondary | ICD-10-CM

## 2014-03-13 MED ORDER — LEVETIRACETAM 500 MG PO TABS: 500.0000 mg | ORAL_TABLET | Freq: Two times a day (BID) | ORAL | Status: DC

## 2014-03-13 NOTE — Discharge Summary (Signed)
Physician Discharge Summary  Claudia Castaneda FAO:[id number] DOB: June 04, 1945 DOA: 03/11/2014  PCP: No PCP Per Patient  Admit date: 03/11/2014 Discharge date: 03/13/2014  Time spent: 35 minutes  Recommendations for Outpatient Follow-up:  1. Patient admitted for syncope, felt to be due to seizures given EEG findings. She was discharged on Keppra 500 mg by mouth twice a day. 2. Follow up on BMP and CBC on hospital follow-up   Discharge Diagnoses:  Principal Problem:   Seizures Active Problems:   Syncope and collapse   Ulcerative colitis   Syncope   Nasal contusion   Discharge Condition: Stable/improved  Diet recommendation: Regular diet  Filed Weights   03/11/14 1730 03/11/14 2134  Weight: 54.432 kg (120 lb) 54.432 kg (120 lb)    History of present illness:  Presented with  Patient was taking regular walk that she does every day and was noted to fall. She thinks that she has tripped but unsure. She was seen by somoone and 911 was called. She fell hiting her head. [name] was somewhat confused on EMS arrival brought to ER. CT non acute. Per neurology given confusion would like observation. Patient reports history of brain anuerism at the age of 19 that was treated by Craniectomy. No hx of seizures. She has been hospitalised in [place] for confusion to evaluate for TIA in the past. Friend who is with her states she is back to baseline.  Hospital Course:  Patient is a pleasant 69 year old female with a past medical history of ulcerative colitis, admitted to the medicine service on 03/11/2014 after having a syncopal event earlier today. She reported being in her usual state health went for walk in her neighborhood, having a fall on the sidewalk and not remembering the rest. EMS found her to be confused. She was initially worked up with a CT scan of brain which did not show acute intracranial abnormalities. She was admitted to telemetry, troponins cycled and remain negative overnight. She was  further worked up with a transthoracic echocardiogram showing ejection fraction of 60-65%, carotid Dopplers were unremarkable and MRI showed no acute intracranial abnormalities. 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 Patient reporting having a syncopal episode in [place] last December. It is quite possible that syncopal events may be explained by seizures, furthermore confusion noticed by EMS personnel may have been postictal state. Patient was seen and evaluated by neurology who recommended starting Keppra. She was discharged home in stable condition on 03/13/2014 with instructions to keep her appointment with neurology. She was advised not to drive for 6 months  Consultations:  Neurology  Discharge Exam: Filed Vitals:   03/13/14 0938  BP: 121/71  Pulse: 81  Temp: 97.5 F (36.4 C)  Resp: 18    General: No acute distress, patient reports doing well, tolerating by mouth intake. Cardiovascular: Regular rate and rhythm normal S1-S2 Respiratory: Clear to auscultation bilaterally no wheezing rhonchi or rales Abdomen: Soft nontender nondistended  Discharge Instructions   Discharge Instructions    Call MD for:  difficulty breathing, headache or visual disturbances    Complete by:  As directed      Call MD for:  extreme fatigue    Complete by:  As directed      Call MD for:  hives    Complete by:  As directed      Call MD for:  persistant dizziness or light-headedness    Complete by:  As directed  Call MD for:  persistant nausea and vomiting    Complete by:  As directed      Call MD for:  redness, tenderness, or signs of infection (pain, swelling, redness, odor or green/yellow discharge around incision site)    Complete by:  As directed      Call MD for:  severe uncontrolled pain    Complete by:  As directed      Call MD for:  temperature >100.4    Complete by:  As directed      Diet - low sodium heart healthy    Complete  by:  As directed      Discharge instructions    Complete by:  As directed   Please do not drive until cleared by your physician Keep your current appointment with your Neurologist.     Increase activity slowly    Complete by:  As directed           Current Discharge Medication List    START taking these medications   Details  levETIRAcetam (KEPPRA) 500 MG tablet Take 1 tablet (500 mg total) by mouth 2 (two) times daily. Qty: 60 tablet, Refills: 1      CONTINUE these medications which have NOT CHANGED   Details  CALCIUM PO Take 1 tablet by mouth daily.    Cholecalciferol (D3 ADULT PO) Take 1 tablet by mouth daily.    mercaptopurine (PURINETHOL) 50 MG tablet Take 50 mg by mouth every morning.    polyvinyl alcohol (LIQUIFILM TEARS) 1.4 % ophthalmic solution Place 1 drop into both eyes 2 (two) times daily as needed for dry eyes.    Multiple Vitamin (MULTIVITAMIN WITH MINERALS) TABS tablet Take 1 tablet by mouth daily.       No Known Allergies    The results of significant diagnostics from this hospitalization (including imaging, microbiology, ancillary and laboratory) are listed below for reference.    Significant Diagnostic Studies: Ct Head Wo Contrast  03/11/2014   CLINICAL DATA:  Fall, nasal laceration, abrasion to the chin, confused, altered mental status. Initial encounter  EXAM: CT HEAD WITHOUT CONTRAST  CT CERVICAL SPINE WITHOUT CONTRAST  TECHNIQUE: Multidetector CT imaging of the head and cervical spine was performed following the standard protocol without intravenous contrast. Multiplanar CT image reconstructions of the cervical spine were also generated.  COMPARISON:  Brain MRI 12/17/2010  FINDINGS: CT HEAD FINDINGS  Evidence of occipital craniotomy noted. Cerebellar atrophy re- demonstrated. Stable degree of mild ventricular dilatation when compared to the dissimilar prior exam. No acute hemorrhage, infarct, or mass lesion is identified. No midline shift. Ventricles are  normal in size. Orbits and paranasal sinuses are unremarkable. No skull fracture. Bandage material is identified over the nose. Allowing for technique, no displaced nasal bone fracture is identified. Orbits are grossly unremarkable.  CT CERVICAL SPINE FINDINGS  C1 through the cervicothoracic junction is visualized in its entirety. Normal alignment. No precervical soft tissue widening. Left apical pleural thickening partly visualized. Minimal disc degenerative change noted at C6-C7. No fracture or dislocation.  IMPRESSION: Chronic intracranial findings as above, no acute intracranial abnormality.  No cervical spine fracture or dislocation.   Electronically Signed   By: Christiana Pellant M.D.   On: 03/11/2014 18:21   Ct Cervical Spine Wo Contrast  03/11/2014   CLINICAL DATA:  Fall, nasal laceration, abrasion to the chin, confused, altered mental status. Initial encounter  EXAM: CT HEAD WITHOUT CONTRAST  CT CERVICAL SPINE WITHOUT CONTRAST  TECHNIQUE: Multidetector CT  imaging of the head and cervical spine was performed following the standard protocol without intravenous contrast. Multiplanar CT image reconstructions of the cervical spine were also generated.  COMPARISON:  Brain MRI 12/17/2010  FINDINGS: CT HEAD FINDINGS  Evidence of occipital craniotomy noted. Cerebellar atrophy re- demonstrated. Stable degree of mild ventricular dilatation when compared to the dissimilar prior exam. No acute hemorrhage, infarct, or mass lesion is identified. No midline shift. Ventricles are normal in size. Orbits and paranasal sinuses are unremarkable. No skull fracture. Bandage material is identified over the nose. Allowing for technique, no displaced nasal bone fracture is identified. Orbits are grossly unremarkable.  CT CERVICAL SPINE FINDINGS  C1 through the cervicothoracic junction is visualized in its entirety. Normal alignment. No precervical soft tissue widening. Left apical pleural thickening partly visualized. Minimal disc  degenerative change noted at C6-C7. No fracture or dislocation.  IMPRESSION: Chronic intracranial findings as above, no acute intracranial abnormality.  No cervical spine fracture or dislocation.   Electronically Signed   By: Christiana Pellant M.D.   On: 03/11/2014 18:21   [name] Contrast  03/12/2014   CLINICAL DATA:  Syncope and collapse. Fall forward, bumping the head. Initial encounter.  EXAM: MRI HEAD WITHOUT CONTRAST  TECHNIQUE: Multiplanar, multiecho pulse sequences of the brain and surrounding structures were obtained without intravenous contrast.  COMPARISON:  Head CT 03/11/2014 and MRI 12/17/2010  FINDINGS: There is no evidence of acute infarct, midline shift, or extra-axial fluid collection. 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 Moderate enlargement of ventricles is unchanged from the prior MRI, likely reflective of central predominant cerebral atrophy. Sequelae of prior suboccipital craniectomy are again identified.  Cerebellar atrophy, encephalomalacia, and gliosis do not appear significantly changed. 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 Scattered, small foci of T2 hyperintensity in the cerebral white matter are unchanged and nonspecific but may reflect minimal chronic small vessel ischemic change.  Orbits are unremarkable. Paranasal sinuses and mastoid air cells are clear. Major intracranial vascular flow voids are preserved.  IMPRESSION: 1. No acute intracranial abnormality identified. 2. Unchanged, extensive superficial siderosis. Unchanged cerebellar encephalomalacia and partially calcified lesion in the left cerebellar hemisphere.   Electronically Signed   By: Sebastian Ache   On: 03/12/2014 08:14   Dg  Chest Portable 1 View  03/11/2014   CLINICAL DATA:  Fall  EXAM: PORTABLE CHEST - 1 VIEW  COMPARISON:  None.  FINDINGS: Lungs are clear.  No pleural effusion or pneumothorax.  The heart is normal in size.  IMPRESSION: No evidence of acute cardiopulmonary disease.   Electronically Signed   By: Charline Bills M.D.   On: 03/11/2014 18:09    Microbiology: No results found for this or any previous visit (from the past 240 hour(s)).   Labs: Basic Metabolic Panel:  Recent Labs Lab 03/11/14 1735 03/12/14 0840  NA 141 141  K 3.6 3.6  CL 104 110  CO2 28 23  GLUCOSE 104* 93  BUN 15 9  CREATININE 0.78 0.74  CALCIUM 9.5 8.4  MG  --  1.9  PHOS  --  3.9   Liver Function Tests:  Recent Labs Lab 03/11/14 1735 03/12/14 0840  AST 34 21  ALT 22 17  ALKPHOS 79 74  BILITOT 0.6 0.6  PROT 7.0 5.9*  ALBUMIN 4.0 3.5   No results for input(s): LIPASE, AMYLASE in the last 168 hours. No results for input(s): AMMONIA in the last 168 hours. CBC:  Recent Labs Lab 03/11/14 1735 03/12/14 0840  WBC 6.1 6.2  HGB 15.5* 15.1*  HCT 44.9 43.4  MCV 93.5 95.0  PLT 267 207   Cardiac Enzymes:  Recent Labs Lab 03/11/14 2039 03/12/14 0552 03/12/14 0840  TROPONINI <0.03 <0.03 <0.03   BNP: BNP (last 3 results) No results for input(s): BNP in the last 8760 hours.  ProBNP (last 3 results) No results for input(s): PROBNP in the last 8760 hours.  CBG: No results for input(s): GLUCAP in the last 168 hours.     SignedJeralyn Bennett:  Shamela Haydon  [facility] 03/13/2014, 1:49 PM

## 2014-03-13 NOTE — Progress Notes (Signed)
VASCULAR LAB PRELIMINARY  PRELIMINARY  PRELIMINARY  PRELIMINARY  Carotid Dopplers completed.    Preliminary report:  1-39% ICA stenosis.  Vertebral artery flow is antegrade.   Claudia Castaneda, RVT 03/13/2014, 8:47 AM

## 2014-03-13 NOTE — Progress Notes (Signed)
  Echocardiogram 2D Echocardiogram has been performed.  Claudia Castaneda, Claudia Castaneda A 03/13/2014, 10:19 AM

## 2014-03-13 NOTE — Progress Notes (Signed)
Discharge orders received.  Discharge instructions and follow-up appointments reviewed with the patient.  Prescription given to the patient.  VSS upon discharge.  IV removed and education complete.  Transported out via wheelchair. Sondra ComeSilva, Raynisha Avilla M, RN

## 2014-03-13 NOTE — Progress Notes (Signed)
Utilization Review Completed.   Shondell Poulson, RN, BSN Nurse Case Manager  

## 2014-03-13 NOTE — Progress Notes (Addendum)
[facility] PROGRESS NOTE  [name]:[id number] DOB: 05/08/1945 DOA: 03/11/2014 PCP: No PCP Per Patient  Assessment/Plan: 1. Syncope -Patient reporting having a syncopal event back in December and a second one prior to this hospitalization -Brain imaging studies were unremarkable, awaiting transthoracic echocardiogram and carotid Doppler results -Spoke to neurology however as it appears that she may be having a seizure focus in left temporal region -Patient to be loaded with thousand milligrams of Keppra followed by 500 mg by mouth twice a day  2.  Acute confusion. -Improved -Suspect that this may have been due to a postictal state since it is likely that patient had a seizure given EEG results.  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   Clinical correlation is advised    HPI/Subjective: Patient doing better today, she is awake, alert, oriented 3  Objective: Filed Vitals:   03/13/14 0938  BP: 121/71  Pulse: 81  Temp: 97.5 F (36.4 C)  Resp: 18   No intake or output data in the 24 hours ending 03/13/14 1359 Filed Weights   03/11/14 1730 03/11/14 2134  Weight: 54.432 kg (120 lb) 54.432 kg (120 lb)    Exam:   General:  No acute distress  Cardiovascular: Regular rate and rhythm normal S1-S2 no murmurs rubs or gallops  Respiratory: Normal respiratory effort lungs are clear  Abdomen: Soft nontender nondistended  Musculoskeletal: No edema  Data Reviewed: Basic Metabolic Panel:  Recent Labs Lab 03/11/14 1735 03/12/14 0840  NA 141 141  K 3.6 3.6  CL 104 110  CO2 28 23  GLUCOSE 104* 93  BUN 15 9  CREATININE 0.78 0.74  CALCIUM 9.5 8.4  MG  --   1.9  PHOS  --  3.9   Liver Function Tests:  Recent Labs Lab 03/11/14 1735 03/12/14 0840  AST 34 21  ALT 22 17  ALKPHOS 79 74  BILITOT 0.6 0.6  PROT 7.0 5.9*  ALBUMIN 4.0 3.5   No results for input(s): LIPASE, AMYLASE in the last 168 hours. No results for input(s): AMMONIA in the last 168 hours. CBC:  Recent Labs Lab 03/11/14 1735 03/12/14 0840  WBC 6.1 6.2  HGB 15.5* 15.1*  HCT 44.9 43.4  MCV 93.5 95.0  PLT 267 207   Cardiac Enzymes:  Recent Labs Lab 03/11/14 2039 03/12/14 0552 03/12/14 0840  TROPONINI <0.03 <0.03 <0.03   BNP (last 3 results) No results for input(s): BNP in the last 8760 hours.  ProBNP (last 3 results) No results for input(s): PROBNP in the last 8760 hours.  CBG: No results for input(s): GLUCAP in the last 168 hours.  No results found for this or any previous visit (from the past 240 hour(s)).   Studies: Ct Head Castaneda Contrast  03/11/2014   CLINICAL DATA:  Fall, nasal laceration, abrasion to the chin, confused, altered mental status. Initial encounter  EXAM: CT HEAD WITHOUT CONTRAST  CT CERVICAL SPINE WITHOUT CONTRAST  TECHNIQUE: Multidetector CT imaging of the head and cervical spine was performed following the standard protocol without intravenous contrast. Multiplanar CT image reconstructions of the cervical spine were also generated.  COMPARISON:  Brain MRI 12/17/2010  FINDINGS: CT HEAD FINDINGS  Evidence of occipital craniotomy noted. Cerebellar atrophy re- demonstrated. Stable  degree of mild ventricular dilatation when compared to the dissimilar prior exam. No acute hemorrhage, infarct, or mass lesion is identified. No midline shift. Ventricles are normal in size. Orbits and paranasal sinuses are unremarkable. No skull fracture. Bandage material is identified over the nose. Allowing for technique, no displaced nasal bone fracture is identified. Orbits are grossly unremarkable.  CT CERVICAL SPINE FINDINGS  C1 through the cervicothoracic junction  is visualized in its entirety. Normal alignment. No precervical soft tissue widening. Left apical pleural thickening partly visualized. Minimal disc degenerative change noted at C6-C7. No fracture or dislocation.  IMPRESSION: Chronic intracranial findings as above, no acute intracranial abnormality.  No cervical spine fracture or dislocation.   Electronically Signed   By: Christiana PellantGretchen  Green M.D.   On: 03/11/2014 18:21   Ct Cervical Spine Castaneda Contrast  03/11/2014   CLINICAL DATA:  Fall, nasal laceration, abrasion to the chin, confused, altered mental status. Initial encounter  EXAM: CT HEAD WITHOUT CONTRAST  CT CERVICAL SPINE WITHOUT CONTRAST  TECHNIQUE: Multidetector CT imaging of the head and cervical spine was performed following the standard protocol without intravenous contrast. Multiplanar CT image reconstructions of the cervical spine were also generated.  COMPARISON:  Brain MRI 12/17/2010  FINDINGS: CT HEAD FINDINGS  Evidence of occipital craniotomy noted. Cerebellar atrophy re- demonstrated. Stable degree of mild ventricular dilatation when compared to the dissimilar prior exam. No acute hemorrhage, infarct, or mass lesion is identified. No midline shift. Ventricles are normal in size. Orbits and paranasal sinuses are unremarkable. No skull fracture. Bandage material is identified over the nose. Allowing for technique, no displaced nasal bone fracture is identified. Orbits are grossly unremarkable.  CT CERVICAL SPINE FINDINGS  C1 through the cervicothoracic junction is visualized in its entirety. Normal alignment. No precervical soft tissue widening. Left apical pleural thickening partly visualized. Minimal disc degenerative change noted at C6-C7. No fracture or dislocation.  IMPRESSION: Chronic intracranial findings as above, no acute intracranial abnormality.  No cervical spine fracture or dislocation.   Electronically Signed   By: Christiana PellantGretchen  Green M.D.   On: 03/11/2014 18:21   Mr Brain Castaneda  Contrast  03/12/2014   CLINICAL DATA:  Syncope and collapse. Fall forward, bumping the head. Initial encounter.  EXAM: MRI HEAD WITHOUT CONTRAST  TECHNIQUE: Multiplanar, multiecho pulse sequences of the brain and surrounding structures were obtained without intravenous contrast.  COMPARISON:  Head CT 03/11/2014 and MRI 12/17/2010  FINDINGS: There is no evidence of acute infarct, midline shift, or extra-axial fluid collection. 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 Moderate enlargement of ventricles is unchanged from the prior MRI, likely reflective of central predominant cerebral atrophy. Sequelae of prior suboccipital craniectomy are again identified.  Cerebellar atrophy, encephalomalacia, and gliosis do not appear significantly changed. 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 Scattered, small foci of T2 hyperintensity in the cerebral white matter are unchanged and nonspecific but may reflect minimal chronic small vessel ischemic change.  Orbits are unremarkable. Paranasal sinuses and mastoid air cells are clear. Major intracranial vascular flow voids are preserved.  IMPRESSION: 1. No acute intracranial abnormality identified. 2. Unchanged, extensive superficial siderosis. Unchanged cerebellar encephalomalacia and partially calcified lesion in the left cerebellar hemisphere.   Electronically Signed   By: Sebastian AcheAllen  Grady   On: 03/12/2014 08:14  Dg Chest Portable 1 View  03/11/2014   CLINICAL DATA:  Fall  EXAM: PORTABLE CHEST - 1 VIEW  COMPARISON:  None.  FINDINGS: Lungs are clear.  No pleural effusion or pneumothorax.  The heart is normal in size.  IMPRESSION: No evidence of acute cardiopulmonary disease.   Electronically Signed    By: Charline Bills M.D.   On: 03/11/2014 18:09    Scheduled Meds: . aspirin EC  81 mg Oral Daily  . neomycin-bacitracin-polymyxin   Topical BID  . sodium chloride  3 mL Intravenous Q12H   Continuous Infusions:   Principal Problem:   Seizures Active Problems:   Syncope and collapse   Ulcerative colitis   Syncope   Nasal contusion    Time spent: 25 min    Claudia Castaneda  [facility] Pager [phone]. If 7PM-7AM, please contact night-coverage at [email or web address], password [facility] 03/13/2014, 1:59 PM  LOS: 2 days

## 2014-08-01 ENCOUNTER — Encounter (HOSPITAL_COMMUNITY): Payer: Self-pay | Admitting: Emergency Medicine

## 2014-08-01 ENCOUNTER — Emergency Department (HOSPITAL_COMMUNITY)
Admission: EM | Admit: 2014-08-01 | Discharge: 2014-08-01 | Disposition: A | Discharge status: Home / Self Care | Payer: BC Managed Care – PPO | Source: Home / Self Care | Attending: Family Medicine | Admitting: Family Medicine

## 2014-08-01 ENCOUNTER — Emergency Department (INDEPENDENT_AMBULATORY_CARE_PROVIDER_SITE_OTHER): Payer: BC Managed Care – PPO

## 2014-08-01 DIAGNOSIS — M75101 Unspecified rotator cuff tear or rupture of right shoulder, not specified as traumatic: Secondary | ICD-10-CM

## 2014-08-01 NOTE — Discharge Instructions (Signed)
Thank you for coming in today. Take tylenol for pain.  Follow up with Dr. Ophelia Charter.  Return as needed.   Rotator Cuff Tear The rotator cuff is four tendons that assist in the motion of the shoulder. A rotator cuff tear is a tear in one of these four tendons. It is characterized by pain and weakness of the shoulder. The rotator cuff tendons surround the shoulder ball and socket joint (humeral head). The rotator cuff tendons attach to the shoulder blade (scapula) on one side and the upper arm bone (humerus) on the other side. The rotator cuff is essential for shoulder stability and shoulder motion. SYMPTOMS   Pain around the shoulder, often at the outer portion of the upper arm.  Pain that is worse with shoulder function, especially when reaching overhead or lifting.  Weakness of the shoulder muscles.  Aching when not using your arm; often, pain awakens you at night, especially when sleeping on the affected side.  Tenderness, swelling, warmth, or redness over the outer aspect of the shoulder.  Loss of strength.  Limited motion of the shoulder, especially reaching behind (reaching into one's back pocket) or across your body.  A crackling sound (crepitation) when moving the shoulder.  Biceps tendon pain (in the front of the shoulder) and inflammation, worse with bending the elbow or lifting. CAUSES   Strain from sudden increase in amount or intensity of activity.  Direct blow or injury to the shoulder.  Aging, wear from from normal use.  Roof of the shoulder (acromial) spur. RISK INCREASES WITH:   Contact sports (football, wrestling, or boxing).  Throwing or hitting sports (baseball, tennis, or volleyball).  Weightlifting and bodybuilding.  Heavy labor.  Previous injury to rotator cuff.  Failure to warm up properly before activity.  Inadequate protective equipment.  Increasing age.  Spurring of the outer end of the scapula (acromion).  Cortisone injections.  Poor  shoulder strength and flexibility. PREVENTION  Warm up and stretch properly before activity.  Allow time for rest and recovery between practices and competition.  Maintain physical fitness:  Cardiovascular fitness.  Shoulder flexibility.  Strength and endurance of the rotator cuff muscles and muscles of the shoulder blade.  Learn and use proper technique when throwing or hitting. PROGNOSIS Surgery is often needed. Although, symptoms may go away by themselves. RELATED COMPLICATIONS   Persistent pain that may progress to constant pain.  Shoulder stiffness, frozen shoulder syndrome, or loss of motion.  Recurrence of symptoms, especially if treated without surgery.  Inability to return to same level of sports, even with surgery.  Persistent weakness.  Risks of surgery, including infection, bleeding, injury to nerves, shoulder stiffness, weakness, re-tearing of the rotator cuff tendon.  Deltoid detachment, acromial fracture, and persistent pain. TREATMENT Treatment involves the use of ice and medicine to reduce pain and inflammation. Strengthening and stretching exercise are usually recommended. These exercises may be completed at home or with a therapist. You may also be instructed to modify offending activities. Corticosteroid injections may be given to reduce inflammation. Surgery is usually recommended for athletes. Surgery has the best chance for a full recovery. Surgery involves:  Removal of an inflamed bursa.  Removal of an acromial spur if present.  Suturing the torn tendon back together. Rotator cuff surgeries may be preformed either arthroscopically or through an open incision. Recovery typically takes 6 to 12 months. MEDICATION  If pain medicine is necessary, then nonsteroidal anti-inflammatory medicines, such as aspirin and ibuprofen, or other minor pain relievers, such  as acetaminophen, are often recommended.  Do not take pain medicine for 7 days before  surgery.  Prescription pain relievers are usually only prescribed after surgery. Use only as directed and only as much as you need.  Corticosteroid injections may be given to reduce inflammation. However, there is a limited number of times the joint may be injected with these medicines. HEAT AND COLD  Cold treatment (icing) relieves pain and reduces inflammation. Cold treatment should be applied for 10 to 15 minutes every 2 to 3 hours for inflammation and pain and immediately after any activity that aggravates your symptoms. Use ice packs or massage the area with a piece of ice (ice massage).  Heat treatment may be used prior to performing the stretching and strengthening activities prescribed by your caregiver, physical therapist, or athletic trainer. Use a heat pack or soak the injury in warm water. SEEK MEDICAL CARE IF:   Symptoms get worse or do not improve in 4 to 6 weeks despite treatment.  You experience pain, numbness, or coldness in the hand.  Blue, gray, or dark color appears in the fingernails.  New, unexplained symptoms develop (drugs used in treatment may produce side effects). Document Released: 01/21/2005 Document Revised: 04/15/2011 Document Reviewed: 05/05/2008 [facility] Patient Information 2015 [place], [place]. This information is not intended to replace advice given to you by your health care provider. Make sure you discuss any questions you have with your health care provider.

## 2014-08-01 NOTE — ED Notes (Signed)
C/o right shoulder pain onset 3 weeks... Pain radiates down arm Denies inj/trauma Alert, no signs of acute distress.

## 2014-08-01 NOTE — ED Provider Notes (Signed)
Claudia Castaneda is a 69 y.o. female who presents to Urgent Care today for right shoulder pain. Patient has right shoulder pain worse with overhand motion. No radiating pain weakness or numbness. Symptoms present for several weeks to months now. She has had several falls. 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 She states this is being managed by infectious disease doctor in [facility] [place].   Past Medical History  Diagnosis Date  . Ulcerative colitis   . Brain aneurysm   . NPH (normal pressure hydrocephalus)    Past Surgical History  Procedure Laterality Date  . Brain surgery     History  Substance Use Topics  . Smoking status: Never Smoker   . Smokeless tobacco: Not on file  . Alcohol Use: No   ROS as above Medications: No current facility-administered medications for this encounter.   Current Outpatient Prescriptions  Medication Sig Dispense Refill  . levETIRAcetam (KEPPRA) 500 MG tablet Take 1 tablet (500 mg total) by mouth 2 (two) times daily. 60 tablet 1  . mercaptopurine (PURINETHOL) 50 MG tablet Take 50 mg by mouth every morning.    . polyvinyl alcohol (LIQUIFILM TEARS) 1.4 % ophthalmic solution Place 1 drop into both eyes 2 (two) times daily as needed for dry eyes.    Marland Kitchen. CALCIUM PO Take 1 tablet by mouth daily.    . Cholecalciferol (D3 ADULT PO) Take 1 tablet by mouth daily.    . Multiple Vitamin (MULTIVITAMIN WITH MINERALS) TABS tablet Take 1 tablet by mouth daily.     No Known Allergies   Exam:  BP 114/86 mmHg  Pulse 87  Temp(Src) 97.1 F (36.2 C) (Oral)  Resp 16  SpO2 100% Gen: Well NAD HEENT: EOMI,  MMM  Exts: Brisk capillary refill, warm and well perfused.  Shoulder: Normal-appearing nontender. Active range of motion limited to 70 abduction. Passive range of motion is full but with pain.  Positive  impingement testing. Abduction strength is diminished at 4 out of 5 compared to 5 out of 5 on the left.  No results found for this or any previous visit (from the past 24 hour(s)). Dg Shoulder Right  08/01/2014   CLINICAL DATA:  Right shoulder pain radiating into the right arm. No known injury. Initial encounter.  EXAM: RIGHT SHOULDER - 2+ VIEW  COMPARISON:  None.  FINDINGS: There is no acute bony or joint abnormality. Acromioclavicular osteoarthritis is noted. Image right lung and ribs are clear.  IMPRESSION: No acute finding.  Acromioclavicular osteoarthritis.   Electronically Signed   By: Drusilla Kannerhomas  Dalessio M.D.   On: 08/01/2014 18:02    Assessment and Plan: 69 y.o. female with likely rotator cuff tear. Refer to orthopedics for further evaluation and management. Tylenol for pain.  Discussed warning signs or symptoms. Please see discharge instructions. Patient expresses understanding.     Rodolph BongEvan S Jodie Cavey, MD 08/01/14 [phone]

## 2014-11-09 ENCOUNTER — Encounter: Payer: Self-pay | Admitting: *Deleted

## 2014-11-11 ENCOUNTER — Ambulatory Visit (INDEPENDENT_AMBULATORY_CARE_PROVIDER_SITE_OTHER): Payer: Medicare Other | Admitting: Diagnostic Neuroimaging

## 2014-11-11 ENCOUNTER — Encounter: Payer: Self-pay | Admitting: Diagnostic Neuroimaging

## 2014-11-11 VITALS — BP 120/83 | HR 80 | Ht 60.0 in | Wt 106.0 lb

## 2014-11-11 DIAGNOSIS — B451 Cerebral cryptococcosis: Secondary | ICD-10-CM | POA: Diagnosis not present

## 2014-11-11 DIAGNOSIS — R269 Unspecified abnormalities of gait and mobility: Secondary | ICD-10-CM

## 2014-11-11 DIAGNOSIS — I6789 Other cerebrovascular disease: Secondary | ICD-10-CM | POA: Diagnosis not present

## 2014-11-11 DIAGNOSIS — Z9889 Other specified postprocedural states: Secondary | ICD-10-CM | POA: Diagnosis not present

## 2014-11-11 DIAGNOSIS — G910 Communicating hydrocephalus: Secondary | ICD-10-CM

## 2014-11-11 DIAGNOSIS — G40209 Localization-related (focal) (partial) symptomatic epilepsy and epileptic syndromes with complex partial seizures, not intractable, without status epilepticus: Secondary | ICD-10-CM

## 2014-11-11 DIAGNOSIS — G9689 Other specified disorders of central nervous system: Secondary | ICD-10-CM

## 2014-11-11 NOTE — Progress Notes (Signed)
[facility]  PATIENT: Claudia Castaneda DOB: 1945-12-07  REFERRING CLINICIAN: V Rankins  HISTORY FROM: patient and friend REASON FOR VISIT: new consult    HISTORICAL  CHIEF COMPLAINT:  Chief Complaint  Patient presents with  . Referral    from chronic hyrocephalus    HISTORY OF PRESENT ILLNESS:   69 year old right-handed female here for evaluation of balance difficulties, seizure, hydrocephalus, cryptococcal meningitis.  Patient has a very complex history dating back to age 40 years old. I reviewed prior outside neurology notes, MRI scan results, lab testing for this evaluation. In summary, at age 69 years old patient had onset of headaches, confusion, hallucinations. She went to multiple doctors, hospitals, and ultimately was diagnosed with bleeding in the brain. She underwent suboccipital decompression with evacuation of hematoma. Some of her prior medical records mention possibility of "aneurysm" as a cause of this bleeding. Patient developed complications of this brain injury and patient had to have "holes drilled in the head". Patient was in a coma for several weeks. She "lost 3 weeks" of memory during that time. Based on description, this sounds like patient developed increased intracranial pressure and hydrocephalus requiring burr hole drainage. Ultimately patient was able to recover and complete her schooling including graduating from high school and obtaining a 2 year college degree. She may have had mild sequelae of blurred vision and hearing loss following this brain injury.  Throughout her 21s, 30s and 52s patient had no further neurologic issues. She did have some gradually progressive hearing loss. She had no significant balance her physical limitations.  Sometime in her 65s, patient started to have some balance difficulties. For the past 5 years she has noted some increasing difficulty with balance and coordination. Over the past 3 years patient has had  intermittent attacks of aphasia and slurred speech. Episodes may last up to 5 minutes at a time or patient is unable to get any words out but she is able to hear and understand, move and control her body. She has had approximately 5-10 attacks over the past 3 years.   In December 2015 patient was visiting her family in [place] when she has another one of these attacks. She was taken to the hospital for TIA evaluation. MRI of the brain was obtained demonstrating chronic hemosiderin deposition, hydrocephalus, and patient underwent spinal tap. Apparently following spinal tap patient's symptoms significantly improved. As a result of this evaluation patient was recommended to return to the [place] for consideration of possible VP shunt placement.   Patient had another attack of speech arrest, muscle jerks, falling down and was admitted to the hospital in February 2016 for evaluation. She was diagnosed with probable partial seizure arising from left temporal lobe and treated with antiseizure medication.  Patient followed up with her neurologist in [facility], who continued antiseizure medication but did not recommend VP shunt placement. 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 Patient continues on oral anti-fungal medication at this time and is being followed by ID Dr. Silvestre Mesi. Interestingly patient never had significant headache, fevers, neck pain or other signs of meningitis.  Presently patient has noted some worsening of balance difficulties since starting anti-fungal medication treatment. No further speech arrest attacks.   REVIEW OF SYSTEMS: Full 14 system review of systems performed and notable only for seizure depression  not enough sleep decreased energy.  ALLERGIES: No Known Allergies  HOME  MEDICATIONS: Outpatient Prescriptions Prior to Visit  Medication Sig Dispense Refill  . CALCIUM PO Take 1 tablet by mouth daily.    . Cholecalciferol (D3 ADULT PO) Take 1 tablet by mouth daily.    Marland Kitchen levETIRAcetam (KEPPRA) 500 MG tablet Take 1 tablet (500 mg total) by mouth 2 (two) times daily. 60 tablet 1  . Multiple Vitamin (MULTIVITAMIN WITH MINERALS) TABS tablet Take 1 tablet by mouth daily.    . polyvinyl alcohol (LIQUIFILM TEARS) 1.4 % ophthalmic solution Place 1 drop into both eyes 2 (two) times daily as needed for dry eyes.    Marland Kitchen mercaptopurine (PURINETHOL) 50 MG tablet Take 50 mg by mouth every morning.     No facility-administered medications prior to visit.    PAST MEDICAL HISTORY: Past Medical History  Diagnosis Date  . Ulcerative colitis (HCC)   . Brain aneurysm   . NPH (normal pressure hydrocephalus)   . Memory loss     short term  . Seizures (HCC)     PAST SURGICAL HISTORY: Past Surgical History  Procedure Laterality Date  . Brain surgery      FAMILY HISTORY: Family History  Problem Relation Age of Onset  . Stroke Mother   . Hypertension Mother   . CAD Father     SOCIAL HISTORY:  Social History   Social History  . Marital Status: Widowed    Spouse Name: N/A  . Number of Children: N/A  . Years of Education: N/A   Occupational History  . Not on file.   Social History Main Topics  . Smoking status: Never Smoker   . Smokeless tobacco: Not on file  . Alcohol Use: No  . Drug Use: No  . Sexual Activity: Not Currently   Other Topics Concern  . Not on file   Social History Narrative     PHYSICAL EXAM  GENERAL EXAM/CONSTITUTIONAL: Vitals:  Filed Vitals:   11/11/14 0932  BP: 120/83  Pulse: 80  Height: 5' (1.524 m)  Weight: 106 lb (48.081 kg)     Body mass index is 20.7 kg/(m^2).  No exam data present  Patient is in no distress; well developed, nourished and groomed; neck is supple  CARDIOVASCULAR:  Examination of carotid arteries  is normal; no carotid bruits  Regular rate and rhythm, no murmurs  Examination of peripheral vascular system by observation and palpation is normal  EYES:  Ophthalmoscopic exam of optic discs and posterior segments is normal; no papilledema or hemorrhages  MUSCULOSKELETAL:  Gait, strength, tone, movements noted in Neurologic exam below  NEUROLOGIC: MENTAL STATUS:  No flowsheet data found.  awake, alert, oriented to person, place and time  recent and remote memory intact  normal attention and concentration  language fluent, comprehension intact, naming intact,   fund of knowledge appropriate  CRANIAL NERVE:   2nd - no papilledema on fundoscopic exam  2nd, 3rd, 4th, 6th - pupils equal and reactive to light, visual fields full to confrontation, extraocular muscles --> SACCADIC DYSMETRIA, no nystagmus  5th - facial sensation symmetric  7th - facial strength symmetric  8th - hearing --> DECREASED HEARING  9th - palate elevates symmetrically, uvula midline  11th - shoulder shrug symmetric  12th - tongue protrusion midline  SLURRED SPEECH  MOTOR:   normal bulk and tone, full strength in the BUE, BLE  SENSORY:   normal and symmetric to light touch, pinprick, temperature, vibration  COORDINATION:  finger-nose-finger, fine finger movements --> DYSMETRIA IN BUE  REFLEXES:   deep tendon reflexes present and symmetric  GAIT/STATION:   WIDE BASED ATAXIC GAIT; STUMBLES; NEEDS ASSISTANCE TO GET UP AND DOWN FROM CHAIR, TABLE, AND WALKING; CANNOT TANDEM, TOE OR HEEL WALK      DIAGNOSTIC DATA (LABS, IMAGING, TESTING) - I reviewed patient records, labs, notes, testing and imaging myself where available.  Lab Results  Component Value Date   WBC 6.2 03/12/2014   HGB 15.1* 03/12/2014   HCT 43.4 03/12/2014   MCV 95.0 03/12/2014   PLT 207 03/12/2014      Component Value Date/Time   NA 141 03/12/2014 0840   K 3.6 03/12/2014 0840   CL 110 03/12/2014 0840     CO2 23 03/12/2014 0840   GLUCOSE 93 03/12/2014 0840   BUN 9 03/12/2014 0840   CREATININE 0.74 03/12/2014 0840   CALCIUM 8.4 03/12/2014 0840   PROT 5.9* 03/12/2014 0840   ALBUMIN 3.5 03/12/2014 0840   AST 21 03/12/2014 0840   ALT 17 03/12/2014 0840   ALKPHOS 74 03/12/2014 0840   BILITOT 0.6 03/12/2014 0840   GFRNONAA 87* 03/12/2014 0840   GFRAA >90 03/12/2014 0840   No results found for: CHOL, HDL, LDLCALC, LDLDIRECT, TRIG, CHOLHDL No results found for: ZOXW9U No results found for: VITAMINB12 Lab Results  Component Value Date   TSH 3.724 03/12/2014   03/13/14 TTE  - Left ventricle: The cavity size was normal. Systolic function wasnormal. The estimated ejection fraction was in the range of 60% to 65%. Wall motion was normal; there were no regional wallmotion abnormalities. - Aortic valve: Mild thickening and calcification, consistent withsclerosis. - Mitral valve: There was mild regurgitation. - Tricuspid valve: There was mild regurgitation. - Pulmonic valve: There was mild regurgitation.  03/13/14 carotid u/s  - Bilateral: intimal wall thickening CCA. Mild soft plaque origin ICA. 1-39% ICA stenosis.  - Vertebral artery flow is antegrade.  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 Clinical correlation is advised.   12/17/10 MRI brain [I reviewed images myself and agree with interpretation. -VRP]  - Advanced superficial siderosis of the brain, more extensive in the posterior fossa. I think this is probably due to hemorrhage from a superficial cavernous angioma at the inferior superficial cerebellum on the left which measures 1 cm in size. Other causes of previous subarachnoid hemorrhage are certainly possible, though not specifically identified. - Extensive scarring within the subarachnoid spaces particularly in the posterior fossa. No  primary lesion of the cranial nerves is identified, but cranial neuropathies could certainly occur in this setting.   01/27/14 CT head - Done in [place] [I reviewed images myself; the following is my interpretation. -VRP]  - moderate-severe ventriculomegaly, sub-occipital craniectomy, left cerebellar calcified lesion   01/31/14 MRI brain -Done in [place] [I reviewed images myself; the following is my interpretation. -VRP]  - superficial siderosis, cerebellar encephalomalacia, partially calcified lesion in the left cerebellum, moderate-severe ventriculomegaly - no acute findings  03/11/14 CT head [I reviewed images myself and agree with interpretation. No change from CT on 01/27/14. -VRP]  - Evidence of occipital craniotomy noted. Cerebellar atrophy re- demonstrated. Stable degree of mild ventricular dilatation when compared to the dissimilar prior exam. No acute hemorrhage, infarct, or mass lesion is identified. No midline shift. Ventricles are normal in size. Orbits and paranasal sinuses are unremarkable. No skull  fracture. Bandage material is identified over the nose. Allowing for technique, no displaced nasal bone fracture is identified. Orbits are grossly unremarkable.   03/11/14 CT cervical spine - C1 through the cervicothoracic junction is visualized in its entirety. Normal alignment. No precervical soft tissue widening. Left apical pleural thickening partly visualized. Minimal disc degenerative change noted at C6-C7. No fracture or dislocation.   03/11/14 MRI brain [I reviewed images myself and agree with interpretation. There is moderate-severe ventriculomegaly, slightly increased compared to MRI from 12/17/10. -VRP]  1. No acute intracranial abnormality identified. 2. Unchanged, extensive superficial siderosis. Unchanged cerebellar encephalomalacia and partially calcified lesion in the left cerebellar hemisphere.   10/14/14 MRI brain [I reviewed images myself and agree with interpretation. I  think that ventriculomegaly is slightly increased compared to MRI on 03/11/14. -VRP]  1. Advanced superficial hemosiderosis.  2. 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  3. Stable calcified inferior left cerebellar mass, the presumedoperative target from remote suboccipital craniectomy.     ASSESSMENT AND PLAN  69 y.o. year old female here with complex medical and neurologic history dating back to age 54 years old, when she had intracranial hemorrhage presumably from some vascular lesion, not clear if this was an aneurysm, arteriovenous malformation or cavernous malformation. Patient underwent treatment with suboccipital decompressive craniectomy, hematoma evacuation, as well as burr hole placement for management of intracranial pressure. Patient had a very good recovery for most of her adult life. She has had worsening symptoms over the past few years with increasing gait difficulty, intermittent episodes of aphasia, and now progressively worsening balance and hearing loss. I reviewed extensive records, imaging and outside notes.  I agree that patient's recent attacks of aphasia, slurred speech, muscle jerks, falls are most likely partial onset seizures arising from the left temporal lobe. Symptoms seem to be controlled with antiseizure medication.  Regarding patient's progressively worsening balance, this is likely multifactorial from prior cerebellar injury, superficial siderosis, deconditioning. In addition recent MRI scan on 10/14/14 showed slight increase in ventriculomegaly compared to prior study in every 2016. This also has slightly progressed since 2012 MRI. Therefore this may represent a secondary neurodegenerative process versus slowly progressive hydrocephalus related to fungal meningitis infection. 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  I will check CT  angiogram of the head to see if we can identify a treatable vascular lesion. If patient is confirmed to have an on secured cerebral aneurysm or arteriovenous malformation, then we may need to pursue neurosurgical consultation. If patient is confirmed to have a cavernous malformation, this may be managed more conservatively. In terms of the change in ventricle size, we will may need to repeat imaging to see if this is progressively worsening and a specific trajectory. If ventricles continue to enlarge, we may need to refer for shunt placement evaluation. The complicating factor in this would be her recent diagnosis of cryptococcal meningitis which would preclude any surgical treatment at this time. Therefore I will also check an lumbar puncture at this time to evaluate for evidence of ongoing infection as well as opening pressure measurement.    Dx:  Superficial siderosis of central nervous system - Plan: CT Angio Head W/Cm &/Or Wo Cm, DG FLUORO GUIDE LUMBAR PUNCTURE  H/O craniotomy - Plan: CT Angio Head W/Cm &/Or Wo Cm, DG FLUORO GUIDE LUMBAR PUNCTURE  Hydrocephalus, communicating - Plan: CT Angio Head W/Cm &/Or Wo Cm, DG FLUORO GUIDE LUMBAR PUNCTURE  Partial  symptomatic epilepsy with complex partial seizures, not intractable, without status epilepticus (HCC) - Plan: CT Angio Head W/Cm &/Or Wo Cm, DG FLUORO GUIDE LUMBAR PUNCTURE  Cryptococcal meningitis (HCC) - Plan: CT Angio Head W/Cm &/Or Wo Cm, DG FLUORO GUIDE LUMBAR PUNCTURE  Gait difficulty - Plan: CT Angio Head W/Cm &/Or Wo Cm, DG FLUORO GUIDE LUMBAR PUNCTURE    PLAN: - consider home health PT - check CTA head to rule out AVM or aneurysm - repeat LP (for infection and opening pressure evaluation) - continue levetiracetam 500mg  twice a day  Orders Placed This Encounter  Procedures  . CT Angio Head W/Cm &/Or Wo Cm  . DG FLUORO GUIDE LUMBAR PUNCTURE  . Vitamin B12  . TSH  . Hemoglobin A1c   Return in about 1 month (around  12/12/2014).  I reviewed images, labs, notes, records myself. I summarized findings and reviewed with patient, for this high risk condition (hydrocephalus, cryptococcal meningitis) requiring high complexity decision making.    Suanne Marker, MD 11/11/2014, 11:03 AM Certified in Neurology, Neurophysiology and Neuroimaging  [facility] [address] [place], [place] [address] [phone]

## 2014-11-11 NOTE — Patient Instructions (Signed)
Thank you for coming to see [place] at [facility]. I hope we have been able to provide you high quality care today.  You may receive a patient satisfaction survey over the next few weeks. We would appreciate your feedback and comments so that we may continue to improve ourselves and the health of our patients.  - I will check CTA head - I will check lumbar puncture - I will check labs    ~~~~~~~~~~~~~~~~~~~~~~~~~~~~~~~~~~~~~~~~~~~~~~~~~~~~~~~~~~~~~~~~~  DR. PENUMALLI'S GUIDE TO HAPPY AND HEALTHY LIVING These are some of my general health and wellness recommendations. Some of them may apply to you better than others. Please use common sense as you try these suggestions and feel free to ask me any questions.   ACTIVITY/FITNESS Mental, social, emotional and physical stimulation are very important for brain and body health. Try learning a new activity (arts, music, language, sports, games).  Keep moving your body to the best of your abilities.    NUTRITION Eat more plants: colorful vegetables, nuts, seeds and berries.  Eat less sugar, salt, preservatives and processed foods.  Avoid toxins such as cigarettes and alcohol.  Drink water when you are thirsty. Warm water with a slice of lemon is an excellent morning drink to start the day.  Consider these websites for more information The Nutrition Source (https://www.henry-hernandez.biz/) Precision Nutrition (WindowBlog.ch)   RELAXATION Consider practicing mindfulness meditation or other relaxation techniques such as deep breathing, prayer, yoga, tai chi, massage. See website mindful.org or the apps Headspace or Calm to help get started.   SLEEP Try to get at least 7-8+ hours sleep per day. Regular exercise and reduced caffeine will help you sleep better. Practice good sleep hygeine techniques. See website sleep.org for more information.   PLANNING Prepare estate planning,  living will, healthcare POA documents. Sometimes this is best planned with the help of an attorney. Theconversationproject.org and agingwithdignity.org are excellent resources.

## 2014-11-12 LAB — VITAMIN B12: Vitamin B-12: 1019 pg/mL — ABNORMAL HIGH (ref 211–946)

## 2014-11-12 LAB — TSH: TSH: 2.79 u[IU]/mL (ref 0.450–4.500)

## 2014-11-12 LAB — HEMOGLOBIN A1C
ESTIMATED AVERAGE GLUCOSE: 131 mg/dL
Hgb A1c MFr Bld: 6.2 % — ABNORMAL HIGH (ref 4.8–5.6)

## 2014-11-14 ENCOUNTER — Telehealth: Payer: Self-pay | Admitting: *Deleted

## 2014-11-14 NOTE — Telephone Encounter (Signed)
Left vm informing patient, per Dr Marjory Lies ,her lab results are normal except for slightly elevated blood sugar. Left this caller's name, number for questions.

## 2014-11-14 NOTE — Telephone Encounter (Signed)
-----   Message from Suanne Marker, MD sent at 11/14/2014  1:23 PM EDT ----- Labs ok except borderline sugar. -VRP

## 2014-11-28 ENCOUNTER — Ambulatory Visit
Admission: RE | Admit: 2014-11-28 | Discharge: 2014-11-28 | Disposition: A | Discharge status: Home / Self Care | Payer: PRIVATE HEALTH INSURANCE | Source: Ambulatory Visit | Attending: Diagnostic Neuroimaging | Admitting: Diagnostic Neuroimaging

## 2014-11-28 ENCOUNTER — Other Ambulatory Visit: Payer: BC Managed Care – PPO

## 2014-11-28 ENCOUNTER — Other Ambulatory Visit: Payer: Self-pay | Admitting: Diagnostic Neuroimaging

## 2014-11-28 DIAGNOSIS — I6789 Other cerebrovascular disease: Principal | ICD-10-CM

## 2014-11-28 DIAGNOSIS — B451 Cerebral cryptococcosis: Secondary | ICD-10-CM

## 2014-11-28 DIAGNOSIS — G40209 Localization-related (focal) (partial) symptomatic epilepsy and epileptic syndromes with complex partial seizures, not intractable, without status epilepticus: Secondary | ICD-10-CM

## 2014-11-28 DIAGNOSIS — G910 Communicating hydrocephalus: Secondary | ICD-10-CM

## 2014-11-28 DIAGNOSIS — G9689 Other specified disorders of central nervous system: Secondary | ICD-10-CM

## 2014-11-28 DIAGNOSIS — Z9889 Other specified postprocedural states: Secondary | ICD-10-CM

## 2014-11-28 DIAGNOSIS — R269 Unspecified abnormalities of gait and mobility: Secondary | ICD-10-CM

## 2014-11-28 LAB — CSF CELL COUNT WITH DIFFERENTIAL
RBC COUNT CSF: 435 uL — AB
Tube #: 3
WBC CSF: 0 uL (ref 0–5)

## 2014-11-28 LAB — PROTEIN, CSF: Total Protein, CSF: 79 mg/dL — ABNORMAL HIGH (ref 15–45)

## 2014-11-28 LAB — GLUCOSE, CSF: Glucose, CSF: 61 mg/dL (ref 43–76)

## 2014-11-28 NOTE — Discharge Instructions (Signed)

## 2014-11-29 ENCOUNTER — Telehealth: Payer: Self-pay | Admitting: Diagnostic Neuroimaging

## 2014-11-29 NOTE — Telephone Encounter (Signed)
Jennifer/[facility] [phone] called to advise that lumbar puncture was done on the 24th, test that was ordered in addition-[facility], they no longer do. Cryptococcal antigen is now being done and they did do this test, just wanted you to be aware. If any questions feel free to call.

## 2014-11-29 NOTE — Telephone Encounter (Signed)
Noted. Thanks. -VRP 

## 2014-11-30 LAB — CRYPTOCOCCAL AG, LTX SCR RFLX TITER: CRYPTOCOCCAL AG SCREEN: NOT DETECTED

## 2014-12-01 ENCOUNTER — Telehealth: Payer: Self-pay | Admitting: Diagnostic Neuroimaging

## 2014-12-01 DIAGNOSIS — I6789 Other cerebrovascular disease: Principal | ICD-10-CM

## 2014-12-01 DIAGNOSIS — G9689 Other specified disorders of central nervous system: Secondary | ICD-10-CM

## 2014-12-01 LAB — CSF CULTURE W GRAM STAIN: Organism ID, Bacteria: NO GROWTH

## 2014-12-01 LAB — CSF CULTURE: GRAM STAIN: NONE SEEN

## 2014-12-01 NOTE — Telephone Encounter (Signed)
Rebecca/[facility] [phone] called to advise not correct labs needed for test. They need recent creatinine result. If hasn't been done recently it will need to be done today in order to do CT/Angiogram of head tomorrow.

## 2014-12-01 NOTE — Telephone Encounter (Addendum)
Spoke with Leanor RubensteinJean, [facility] [phone] who states patient could [facility] across the street from their office at 8:30 am tomorrow for stat labs and keep CT angiogram appt. Informed her will try to reach patient again to inform her. Carney BernJean stated otherwise test will have to be rescheduled.  4:32 pm Left VM on cell phone, for patient requesting she call back today if possible.

## 2014-12-01 NOTE — Telephone Encounter (Signed)
Left VM requesting patient call back to let this RN know if she can come today for labs prior to scheduled CT tomorrow. Advised she would need to be here no later than 4:45 pm today, preferably before that. Requested she call back asap. Left this caller's name, number.

## 2014-12-02 ENCOUNTER — Telehealth: Payer: Self-pay | Admitting: Diagnostic Neuroimaging

## 2014-12-02 ENCOUNTER — Ambulatory Visit
Admission: RE | Admit: 2014-12-02 | Discharge: 2014-12-02 | Disposition: A | Discharge status: Home / Self Care | Payer: PRIVATE HEALTH INSURANCE | Source: Ambulatory Visit | Attending: Diagnostic Neuroimaging | Admitting: Diagnostic Neuroimaging

## 2014-12-02 ENCOUNTER — Other Ambulatory Visit: Payer: Self-pay | Admitting: Neurology

## 2014-12-02 DIAGNOSIS — G9689 Other specified disorders of central nervous system: Secondary | ICD-10-CM

## 2014-12-02 DIAGNOSIS — I6789 Other cerebrovascular disease: Principal | ICD-10-CM

## 2014-12-02 DIAGNOSIS — B451 Cerebral cryptococcosis: Secondary | ICD-10-CM

## 2014-12-02 DIAGNOSIS — G910 Communicating hydrocephalus: Secondary | ICD-10-CM

## 2014-12-02 DIAGNOSIS — Z9889 Other specified postprocedural states: Secondary | ICD-10-CM

## 2014-12-02 DIAGNOSIS — R269 Unspecified abnormalities of gait and mobility: Secondary | ICD-10-CM

## 2014-12-02 DIAGNOSIS — G40209 Localization-related (focal) (partial) symptomatic epilepsy and epileptic syndromes with complex partial seizures, not intractable, without status epilepticus: Secondary | ICD-10-CM

## 2014-12-02 LAB — CREATININE, ISTAT: Creatinine, IStat: 0.7 mg/dL (ref 0.6–1.3)

## 2014-12-02 MED ORDER — IOPAMIDOL (ISOVUE-370) INJECTION 76%
100.0000 mL | Freq: Once | INTRAVENOUS | Status: AC | PRN
  Administered 2014-12-02: 100 mL via INTRAVENOUS

## 2014-12-02 NOTE — Telephone Encounter (Signed)
Spoke with Claudia RubensteinJean, [facility] to inform her patient never returned calls. She stated to call this morning, let her know outcome. Spoke with patient who then gave phone to her sister, Claudia Castaneda and this RN gave her instructions, address, phone number [facility]. Spoke with Carney BernJean again, informed her; she will call lab and inform of patient's arrival. Order printed, signed by work-in Dr Terrace ArabiaYan, [facility]ed to [facility] [address]

## 2014-12-02 NOTE — Telephone Encounter (Signed)
Claudia Castaneda called with Castaneda Result, Creatinine 0.7 normal. Claudia Castaneda will fax this Castaneda result to our office.

## 2014-12-03 LAB — ANAEROBIC CULTURE
GRAM STAIN: NONE SEEN
Gram Stain: NONE SEEN

## 2014-12-15 ENCOUNTER — Ambulatory Visit (INDEPENDENT_AMBULATORY_CARE_PROVIDER_SITE_OTHER): Payer: Medicare Other | Admitting: Diagnostic Neuroimaging

## 2014-12-15 ENCOUNTER — Encounter: Payer: Self-pay | Admitting: Diagnostic Neuroimaging

## 2014-12-15 VITALS — BP 119/81 | HR 86 | Wt 105.4 lb

## 2014-12-15 DIAGNOSIS — I6789 Other cerebrovascular disease: Secondary | ICD-10-CM | POA: Diagnosis not present

## 2014-12-15 DIAGNOSIS — G9689 Other specified disorders of central nervous system: Secondary | ICD-10-CM

## 2014-12-15 DIAGNOSIS — G40209 Localization-related (focal) (partial) symptomatic epilepsy and epileptic syndromes with complex partial seizures, not intractable, without status epilepticus: Secondary | ICD-10-CM

## 2014-12-15 DIAGNOSIS — R269 Unspecified abnormalities of gait and mobility: Secondary | ICD-10-CM

## 2014-12-15 NOTE — Patient Instructions (Signed)
-   consider home health PT  - continue levetiracetam 500mg  twice a day

## 2014-12-15 NOTE — Progress Notes (Addendum)
[facility]  PATIENT: Claudia Castaneda DOB: [birth year]  REFERRING CLINICIAN: V Rankins  HISTORY FROM: patient and friend and sister REASON FOR VISIT: follow up    HISTORICAL  CHIEF COMPLAINT:  Chief Complaint  Patient presents with  . Superficial siderosis of CNS    rm 7, sister -Angelica, friend- Alinda Money  . Follow-up    1 month    HISTORY OF PRESENT ILLNESS:   UPDATE 12/15/14: Since last visit, no new events. Still with balance and slurred speech. More depression according to sister.   PRIOR HPI (11/11/14): 12 year old right-handed female here for evaluation of balance difficulties, seizure, hydrocephalus, cryptococcal meningitis. Patient has a very complex history dating back to age 15 years old. I reviewed prior outside neurology notes, MRI scan results, lab testing for this evaluation. In summary, at age 31 years old patient had onset of headaches, confusion, hallucinations. She went to multiple doctors, hospitals, and ultimately was diagnosed with bleeding in the brain. She underwent suboccipital decompression with evacuation of hematoma. Some of her prior medical records mention possibility of "aneurysm" as a cause of this bleeding. Patient developed complications of this brain injury and patient had to have "holes drilled in the head". Patient was in a coma for several weeks. She "lost 3 weeks" of memory during that time. Based on description, this sounds like patient developed increased intracranial pressure and hydrocephalus requiring burr hole drainage. Ultimately patient was able to recover and complete her schooling including graduating from high school and obtaining a 2 year college degree. She may have had mild sequelae of blurred vision and hearing loss following this brain injury. Throughout her 75s, 30s and 25s patient had no further neurologic issues. She did have some gradually progressive hearing loss. She had no significant balance her physical  limitations. Sometime in her 14s, patient started to have some balance difficulties. For the past 5 years she has noted some increasing difficulty with balance and coordination. Over the past 3 years patient has had intermittent attacks of aphasia and slurred speech. Episodes may last up to 5 minutes at a time or patient is unable to get any words out but she is able to hear and understand, move and control her body. She has had approximately 5-10 attacks over the past 3 years. In December 2015 patient was visiting her family in [place] when she has another one of these attacks. She was taken to the hospital for TIA evaluation. MRI of the brain was obtained demonstrating chronic hemosiderin deposition, hydrocephalus, and patient underwent spinal tap. Apparently following spinal tap patient's symptoms significantly improved. As a result of this evaluation patient was recommended to return to the [place] for consideration of possible VP shunt placement.  Patient had another attack of speech arrest, muscle jerks, falling down and was admitted to the hospital in February 2016 for evaluation. She was diagnosed with probable partial seizure arising from left temporal lobe and treated with antiseizure medication. Patient followed up with her neurologist in [facility], who continued antiseizure medication but did not recommend VP shunt placement. 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 Patient continues on oral anti-fungal medication at this time and is being followed by ID Dr. Silvestre Mesi. Interestingly patient never had significant headache, fevers, neck pain or other signs of meningitis. Presently patient has noted some  worsening of balance difficulties since starting anti-fungal medication treatment. No further speech  arrest attacks.   REVIEW OF SYSTEMS: Full 14 system review of systems performed and notable only for seizure depression not enough sleep decreased energy.  ALLERGIES: No Known Allergies  HOME MEDICATIONS: Outpatient Prescriptions Prior to Visit  Medication Sig Dispense Refill  . CALCIUM PO Take 1 tablet by mouth daily.    . Cholecalciferol (D3 ADULT PO) Take 1 tablet by mouth daily.    . fluconazole (DIFLUCAN) 200 MG tablet TK 1 C PO QD  0  . levETIRAcetam (KEPPRA) 500 MG tablet Take 1 tablet (500 mg total) by mouth 2 (two) times daily. 60 tablet 1  . Multiple Vitamin (MULTIVITAMIN WITH MINERALS) TABS tablet Take 1 tablet by mouth daily.    . polyvinyl alcohol (LIQUIFILM TEARS) 1.4 % ophthalmic solution Place 1 drop into both eyes 2 (two) times daily as needed for dry eyes.    Marland Kitchen. UNABLE TO FIND 99 mg. Med Name:potassium 99     No facility-administered medications prior to visit.    PAST MEDICAL HISTORY: Past Medical History  Diagnosis Date  . Ulcerative colitis (HCC)   . Brain aneurysm   . NPH (normal pressure hydrocephalus)   . Memory loss     short term  . Seizures (HCC)     PAST SURGICAL HISTORY: Past Surgical History  Procedure Laterality Date  . Brain surgery      FAMILY HISTORY: Family History  Problem Relation Age of Onset  . Stroke Mother   . Hypertension Mother   . CAD Father     SOCIAL HISTORY:  Social History   Social History  . Marital Status: Widowed    Spouse Name: N/A  . Number of Children: N/A  . Years of Education: N/A   Occupational History  . Not on file.   Social History Main Topics  . Smoking status: Never Smoker   . Smokeless tobacco: Not on file  . Alcohol Use: No  . Drug Use: No  . Sexual Activity: Not Currently   Other Topics Concern  . Not on file   Social History Narrative     PHYSICAL EXAM  GENERAL EXAM/CONSTITUTIONAL: Vitals:  Filed Vitals:   12/15/14 1527  BP: 119/81  Pulse: 86  Weight: 105 lb 6.4 oz  (47.809 kg)   Body mass index is 20.58 kg/(m^2). No exam data present  Patient is in no distress; well developed, nourished and groomed; neck is supple  CARDIOVASCULAR:  Examination of carotid arteries is normal; no carotid bruits  Regular rate and rhythm, no murmurs  Examination of peripheral vascular system by observation and palpation is normal  EYES:  Ophthalmoscopic exam of optic discs and posterior segments is normal; no papilledema or hemorrhages  MUSCULOSKELETAL:  Gait, strength, tone, movements noted in Neurologic exam below  NEUROLOGIC: MENTAL STATUS:  No flowsheet data found.  awake, alert, oriented to person, place and time  recent and remote memory intact  normal attention and concentration  language fluent, comprehension intact, naming intact,   fund of knowledge appropriate  CRANIAL NERVE:   2nd - no papilledema on fundoscopic exam  2nd, 3rd, 4th, 6th - pupils equal and reactive to light, visual fields full to confrontation, extraocular muscles --> SACCADIC DYSMETRIA, no nystagmus  5th - facial sensation symmetric  7th - facial strength symmetric  8th - hearing --> DECREASED HEARING  9th - palate elevates symmetrically, uvula midline  11th - shoulder shrug symmetric  12th -  tongue protrusion midline  SLURRED SPEECH  MOTOR:   normal bulk and tone, full strength in the BUE, BLE  SENSORY:   normal and symmetric to light touch, pinprick, temperature, vibration  COORDINATION:   finger-nose-finger, fine finger movements --> DYSMETRIA IN BUE  REFLEXES:   deep tendon reflexes present and symmetric  GAIT/STATION:   WIDE BASED ATAXIC GAIT; STUMBLES; NEEDS ASSISTANCE TO GET UP AND DOWN FROM CHAIR, TABLE, AND WALKING; CANNOT TANDEM, TOE OR HEEL WALK     DIAGNOSTIC DATA (LABS, IMAGING, TESTING) - I reviewed patient records, labs, notes, testing and imaging myself where available.  Lab Results  Component Value Date   WBC 6.2  03/12/2014   HGB 15.1* 03/12/2014   HCT 43.4 03/12/2014   MCV 95.0 03/12/2014   PLT 207 03/12/2014      Component Value Date/Time   NA 141 03/12/2014 0840   K 3.6 03/12/2014 0840   CL 110 03/12/2014 0840   CO2 23 03/12/2014 0840   GLUCOSE 93 03/12/2014 0840   BUN 9 03/12/2014 0840   CREATININE 0.74 03/12/2014 0840   CALCIUM 8.4 03/12/2014 0840   PROT 5.9* 03/12/2014 0840   ALBUMIN 3.5 03/12/2014 0840   AST 21 03/12/2014 0840   ALT 17 03/12/2014 0840   ALKPHOS 74 03/12/2014 0840   BILITOT 0.6 03/12/2014 0840   GFRNONAA 87* 03/12/2014 0840   GFRAA >90 03/12/2014 0840   No results found for: CHOL, HDL, LDLCALC, LDLDIRECT, TRIG, CHOLHDL Lab Results  Component Value Date   HGBA1C 6.2* 11/11/2014   Lab Results  Component Value Date   VITAMINB12 1019* 11/11/2014   Lab Results  Component Value Date   TSH 2.790 11/11/2014   03/13/14 TTE  - Left ventricle: The cavity size was normal. Systolic function wasnormal. The estimated ejection fraction was in the range of 60% to 65%. Wall motion was normal; there were no regional wallmotion abnormalities. - Aortic valve: Mild thickening and calcification, consistent withsclerosis. - Mitral valve: There was mild regurgitation. - Tricuspid valve: There was mild regurgitation. - Pulmonic valve: There was mild regurgitation.  03/13/14 carotid u/s  - Bilateral: intimal wall thickening CCA. Mild soft plaque origin ICA. 1-39% ICA stenosis.  - Vertebral artery flow is antegrade.  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 Clinical correlation is advised.   12/17/10 MRI brain [I reviewed images myself and agree with interpretation. -VRP]  - Advanced superficial siderosis of the brain, more extensive in the posterior fossa. I think this is probably due to hemorrhage from a superficial cavernous angioma at  the inferior superficial cerebellum on the left which measures 1 cm in size. Other causes of previous subarachnoid hemorrhage are certainly possible, though not specifically identified. - Extensive scarring within the subarachnoid spaces particularly in the posterior fossa. No primary lesion of the cranial nerves is identified, but cranial neuropathies could certainly occur in this setting.   01/27/14 CT head - Done in [place] [I reviewed images myself; the following is my interpretation. -VRP]  - moderate-severe ventriculomegaly, sub-occipital craniectomy, left cerebellar calcified lesion   01/31/14 MRI brain -Done in [place] [I reviewed images myself; the following is my interpretation. -VRP]  - superficial siderosis, cerebellar encephalomalacia, partially calcified lesion in the left cerebellum, moderate-severe ventriculomegaly - no acute findings  03/11/14 CT head [I reviewed images myself and agree with interpretation. No change from CT on  01/27/14. -VRP]  - Evidence of occipital craniotomy noted. Cerebellar atrophy re- demonstrated. Stable degree of mild ventricular dilatation when compared to the dissimilar prior exam. No acute hemorrhage, infarct, or mass lesion is identified. No midline shift. Ventricles are normal in size. Orbits and paranasal sinuses are unremarkable. No skull fracture. Bandage material is identified over the nose. Allowing for technique, no displaced nasal bone fracture is identified. Orbits are grossly unremarkable.   03/11/14 CT cervical spine - C1 through the cervicothoracic junction is visualized in its entirety. Normal alignment. No precervical soft tissue widening. Left apical pleural thickening partly visualized. Minimal disc degenerative change noted at C6-C7. No fracture or dislocation.   03/11/14 MRI brain [I reviewed images myself and agree with interpretation. There is moderate-severe ventriculomegaly, slightly increased compared to MRI from 12/17/10. -VRP]  1.  No acute intracranial abnormality identified. 2. Unchanged, extensive superficial siderosis. Unchanged cerebellar encephalomalacia and partially calcified lesion in the left cerebellar hemisphere.   10/14/14 MRI brain [I reviewed images myself and agree with interpretation. I think that ventriculomegaly is slightly increased compared to MRI on 03/11/14. -VRP]  1. Advanced superficial hemosiderosis.  2. 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  3. Stable calcified inferior left cerebellar mass, the presumedoperative target from remote suboccipital craniectomy.  12/02/14 CTA head  1. No intracranial aneurysm or high-flow vascular malformation. There is a small venous vessel in proximity to the chronic calcified inferior left cerebellar calcified lesion, but this does not resemble a DVA. Still, the possibility that the cerebellar lesion is a superficial cavernous malformation remains. 2. Stable CT appearance of the brain. No acute intracranial abnormality.  11/28/14 LP (opening pressure 12cm H2O) - CSF: WBC 0, RBC 435, protein 79, glucose 61, cryptococcal antigen negative, fungal stain and culture negative      ASSESSMENT AND PLAN  69 y.o. year old female here with complex medical and neurologic history dating back to age 57 years old, when she had intracranial hemorrhage presumably from a cerebellar cavernous malformation. Patient underwent treatment with suboccipital decompressive craniectomy, hematoma evacuation, as well as burr hole placement for management of intracranial pressure. Patient had a very good recovery for most of her adult life. She has had worsening symptoms over the past few years with increasing gait difficulty, intermittent episodes of aphasia, and now progressively worsening balance and hearing loss. I reviewed extensive records, imaging and outside notes.  I agree that patient's recent attacks of aphasia, slurred speech,  muscle jerks, falls are most likely partial onset seizures arising from the left temporal lobe. Symptoms seem to be controlled with antiseizure medication.  Regarding patient's progressively worsening balance, this is likely multifactorial from prior cerebellar injury, superficial siderosis, deconditioning and possible fungal meningitis. In addition recent MRI scan on 10/14/14 showed slight increase in ventriculomegaly compared to prior study in every 2016. This also has slightly progressed since 2012 MRI. Therefore this may represent a secondary neurodegenerative process versus slowly progressive hydrocephalus related to fungal meningitis infection. 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  CT angiogram of the head --> chronic left cerebellar cavernous malformation, likely the cause of superficial siderosis, which can be managed more conservatively.   In terms of the change in ventricle size, may repeat imaging in 6-12 months to see if this is progressively worsening and has a specific trajectory.     Dx:  Partial symptomatic epilepsy with complex partial seizures, not intractable, without status epilepticus (HCC)  Superficial siderosis of central nervous system  Gait difficulty    PLAN: - consider home health PT  - continue levetiracetam 500mg  twice a day  Return in about 6 months (around 06/14/2015).    Suanne Marker, MD 12/15/2014, 4:05 PM Certified in Neurology, Neurophysiology and Neuroimaging  [facility] [address] [place], [place] [address] [phone]

## 2014-12-24 LAB — FUNGUS CULTURE W SMEAR: SMEAR RESULT: NONE SEEN

## 2015-06-16 ENCOUNTER — Ambulatory Visit: Payer: Medicare Other | Admitting: Diagnostic Neuroimaging

## 2015-07-28 ENCOUNTER — Ambulatory Visit (INDEPENDENT_AMBULATORY_CARE_PROVIDER_SITE_OTHER): Payer: Medicare Other | Admitting: Diagnostic Neuroimaging

## 2015-07-28 ENCOUNTER — Encounter: Payer: Self-pay | Admitting: Diagnostic Neuroimaging

## 2015-07-28 VITALS — BP 117/76 | HR 85 | Ht 60.0 in | Wt 105.4 lb

## 2015-07-28 DIAGNOSIS — I6789 Other cerebrovascular disease: Secondary | ICD-10-CM | POA: Diagnosis not present

## 2015-07-28 DIAGNOSIS — R269 Unspecified abnormalities of gait and mobility: Secondary | ICD-10-CM

## 2015-07-28 DIAGNOSIS — G910 Communicating hydrocephalus: Secondary | ICD-10-CM

## 2015-07-28 DIAGNOSIS — B451 Cerebral cryptococcosis: Secondary | ICD-10-CM | POA: Diagnosis not present

## 2015-07-28 DIAGNOSIS — G9689 Other specified disorders of central nervous system: Secondary | ICD-10-CM

## 2015-07-28 DIAGNOSIS — G40209 Localization-related (focal) (partial) symptomatic epilepsy and epileptic syndromes with complex partial seizures, not intractable, without status epilepticus: Secondary | ICD-10-CM

## 2015-07-28 MED ORDER — LEVETIRACETAM 500 MG PO TABS: 500.0000 mg | ORAL_TABLET | Freq: Two times a day (BID) | ORAL | Status: AC

## 2015-07-28 NOTE — Progress Notes (Signed)
[facility]  PATIENT: Claudia Castaneda DOB: Mar 28, 1945  REFERRING CLINICIAN: V Rankins  HISTORY FROM: patient and friend REASON FOR VISIT: follow up    HISTORICAL  CHIEF COMPLAINT:  Chief Complaint  Patient presents with  . Partial symptomatic epilepsy    rm 7  . Follow-up    6 month    HISTORY OF PRESENT ILLNESS:   UPDATE 07/28/15: Since last visit, doing well. No more seizures. Still with balance ad speech issues. Now with ulcerative colitis flare up (Dr. Kinnie Scales), and now on medications.   UPDATE 12/15/14: Since last visit, no new events. Still with balance and slurred speech. More depression according to sister.   PRIOR HPI (11/11/14): 30 year old right-handed female here for evaluation of balance difficulties, seizure, hydrocephalus, cryptococcal meningitis. Patient has a very complex history dating back to age 68 years old. I reviewed prior outside neurology notes, MRI scan results, lab testing for this evaluation. In summary, at age 70 years old patient had onset of headaches, confusion, hallucinations. She went to multiple doctors, hospitals, and ultimately was diagnosed with bleeding in the brain. She underwent suboccipital decompression with evacuation of hematoma. Some of her prior medical records mention possibility of "aneurysm" as a cause of this bleeding. Patient developed complications of this brain injury and patient had to have "holes drilled in the head". Patient was in a coma for several weeks. She "lost 3 weeks" of memory during that time. Based on description, this sounds like patient developed increased intracranial pressure and hydrocephalus requiring burr hole drainage. Ultimately patient was able to recover and complete her schooling including graduating from high school and obtaining a 2 year college degree. She may have had mild sequelae of blurred vision and hearing loss following this brain injury. Throughout her 68s, 30s and 53s patient had  no further neurologic issues. She did have some gradually progressive hearing loss. She had no significant balance her physical limitations. Sometime in her 68s, patient started to have some balance difficulties. For the past 5 years she has noted some increasing difficulty with balance and coordination. Over the past 3 years patient has had intermittent attacks of aphasia and slurred speech. Episodes may last up to 5 minutes at a time or patient is unable to get any words out but she is able to hear and understand, move and control her body. She has had approximately 5-10 attacks over the past 3 years. In December 2015 patient was visiting her family in [place] when she has another one of these attacks. She was taken to the hospital for TIA evaluation. MRI of the brain was obtained demonstrating chronic hemosiderin deposition, hydrocephalus, and patient underwent spinal tap. Apparently following spinal tap patient's symptoms significantly improved. As a result of this evaluation patient was recommended to return to the [place] for consideration of possible VP shunt placement.  Patient had another attack of speech arrest, muscle jerks, falling down and was admitted to the hospital in February 2016 for evaluation. She was diagnosed with probable partial seizure arising from left temporal lobe and treated with antiseizure medication. Patient followed up with her neurologist in [facility], who continued antiseizure medication but did not recommend VP shunt placement. 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 Patient continues on oral anti-fungal medication at this time and is being followed  by ID Dr. Silvestre Mesi. Interestingly patient never had significant headache, fevers, neck pain or other signs of meningitis.  Presently patient has noted some worsening of balance difficulties since starting anti-fungal medication treatment. No further speech arrest attacks.   REVIEW OF SYSTEMS: Full 14 system review of systems performed and negative except: dizziness easy bruising.    ALLERGIES: Allergies  Allergen Reactions  . Pancreatin Rash    Pt not positive of the name of med that was given to her in the hospital when she had a fungus in here spinal fluid    HOME MEDICATIONS: Outpatient Prescriptions Prior to Visit  Medication Sig Dispense Refill  . acetaminophen (TYLENOL) 325 MG tablet Take 325 mg by mouth.    Marland Kitchen CALCIUM PO Take 1 tablet by mouth daily.    . Cholecalciferol (D3 ADULT PO) Take 1 tablet by mouth daily.    . Multiple Vitamin (MULTIVITAMIN WITH MINERALS) TABS tablet Take 1 tablet by mouth daily.    . polyvinyl alcohol (LIQUIFILM TEARS) 1.4 % ophthalmic solution Place 1 drop into both eyes 2 (two) times daily as needed for dry eyes.    Marland Kitchen UNABLE TO FIND 99 mg. Med Name:potassium 99    . levETIRAcetam (KEPPRA) 500 MG tablet Take 1 tablet (500 mg total) by mouth 2 (two) times daily. 60 tablet 1  . fluconazole (DIFLUCAN) 200 MG tablet TK 1 C PO QD  0   No facility-administered medications prior to visit.    PAST MEDICAL HISTORY: Past Medical History  Diagnosis Date  . Ulcerative colitis (HCC)   . Brain aneurysm   . NPH (normal pressure hydrocephalus)   . Memory loss     short term  . Seizures (HCC)     last sz 03/2014    PAST SURGICAL HISTORY: Past Surgical History  Procedure Laterality Date  . Brain surgery      FAMILY HISTORY: Family History  Problem Relation Age of Onset  . Stroke Mother   . Hypertension Mother   . CAD Father     SOCIAL HISTORY:  Social History   Social History  . Marital Status: Widowed    Spouse Name: N/A  . Number of Children: N/A  . Years of Education: N/A   Occupational History  . Not on file.   Social History Main Topics  . Smoking  status: Never Smoker   . Smokeless tobacco: Not on file  . Alcohol Use: No  . Drug Use: No  . Sexual Activity: Not Currently   Other Topics Concern  . Not on file   Social History Narrative     PHYSICAL EXAM  GENERAL EXAM/CONSTITUTIONAL: Vitals:  Filed Vitals:   07/28/15 0816  BP: 117/76  Pulse: 85  Height: 5' (1.524 m)  Weight: 105 lb 6.4 oz (47.809 kg)   Body mass index is 20.58 kg/(m^2). No exam data present  Patient is in no distress; well developed, nourished and groomed; neck is supple  CARDIOVASCULAR:  Examination of carotid arteries is normal; no carotid bruits  Regular rate and rhythm, no murmurs  Examination of peripheral vascular system by observation and palpation is normal  EYES:  Ophthalmoscopic exam of optic discs and posterior segments is normal; no papilledema or hemorrhages  MUSCULOSKELETAL:  Gait, strength, tone, movements noted in Neurologic exam below  NEUROLOGIC: MENTAL STATUS:  No flowsheet data found.  awake, alert, oriented to person, place and time  recent and remote memory intact  normal attention and concentration  language fluent, comprehension  intact, naming intact,   fund of knowledge appropriate  CRANIAL NERVE:   2nd - no papilledema on fundoscopic exam  2nd, 3rd, 4th, 6th - pupils equal and reactive to light, visual fields full to confrontation, extraocular muscles --> SACCADIC DYSMETRIA, no nystagmus  5th - facial sensation symmetric  7th - facial strength symmetric  8th - hearing --> DECREASED HEARING  9th - palate elevates symmetrically, uvula midline  11th - shoulder shrug symmetric  12th - tongue protrusion midline  SLURRED SPEECH  MOTOR:   normal bulk and tone, full strength in the BUE, BLE;   SENSORY:   normal and symmetric to light touch, pinprick, temperature, vibration  COORDINATION:   finger-nose-finger, fine finger movements --> DYSMETRIA IN BUE  REFLEXES:   deep tendon reflexes  present and symmetric  GAIT/STATION:   WIDE BASED ATAXIC GAIT; STUMBLES; NEEDS ASSISTANCE TO GET UP AND DOWN FROM CHAIR, TABLE, AND WALKING; CANNOT TANDEM, TOE OR HEEL WALK     DIAGNOSTIC DATA (LABS, IMAGING, TESTING) - I reviewed patient records, labs, notes, testing and imaging myself where available.  Lab Results  Component Value Date   WBC 6.2 03/12/2014   HGB 15.1* 03/12/2014   HCT 43.4 03/12/2014   MCV 95.0 03/12/2014   PLT 207 03/12/2014      Component Value Date/Time   NA 141 03/12/2014 0840   K 3.6 03/12/2014 0840   CL 110 03/12/2014 0840   CO2 23 03/12/2014 0840   GLUCOSE 93 03/12/2014 0840   BUN 9 03/12/2014 0840   CREATININE 0.7 12/02/2014 0921   CREATININE 0.74 03/12/2014 0840   CALCIUM 8.4 03/12/2014 0840   PROT 5.9* 03/12/2014 0840   ALBUMIN 3.5 03/12/2014 0840   AST 21 03/12/2014 0840   ALT 17 03/12/2014 0840   ALKPHOS 74 03/12/2014 0840   BILITOT 0.6 03/12/2014 0840   GFRNONAA 87* 03/12/2014 0840   GFRAA >90 03/12/2014 0840   No results found for: CHOL, HDL, LDLCALC, LDLDIRECT, TRIG, CHOLHDL Lab Results  Component Value Date   HGBA1C 6.2* 11/11/2014   Lab Results  Component Value Date   VITAMINB12 1019* 11/11/2014   Lab Results  Component Value Date   TSH 2.790 11/11/2014   03/13/14 TTE  - Left ventricle: The cavity size was normal. Systolic function wasnormal. The estimated ejection fraction was in the range of 60% to 65%. Wall motion was normal; there were no regional wallmotion abnormalities. - Aortic valve: Mild thickening and calcification, consistent withsclerosis. - Mitral valve: There was mild regurgitation. - Tricuspid valve: There was mild regurgitation. - Pulmonic valve: There was mild regurgitation.  03/13/14 carotid u/s  - Bilateral: intimal wall thickening CCA. Mild soft plaque origin ICA. 1-39% ICA stenosis.  - Vertebral artery flow is antegrade.  03/12/14 EEG - Impression: this is an abnormal awake and drowsy EEG with  findings consistent with the interictal expression of a focal epilepsy arising from the left temporal region. Clinical correlation is advised.   12/17/10 MRI brain [I reviewed images myself and agree with interpretation. -VRP]  - Advanced superficial siderosis of the brain, more extensive in the posterior fossa. I think this is probably due to hemorrhage from a superficial cavernous angioma at the inferior superficial cerebellum on the left which measures 1 cm in size. Other causes of previous subarachnoid hemorrhage are certainly possible, though not specifically identified. - Extensive scarring within the subarachnoid spaces particularly in the posterior fossa. No primary lesion of the cranial nerves is identified, but cranial neuropathies could  certainly occur in this setting.   01/27/14 CT head - Done in [place] [I reviewed images myself; the following is my interpretation. -VRP]  - moderate-severe ventriculomegaly, sub-occipital craniectomy, left cerebellar calcified lesion   01/31/14 MRI brain -Done in [place] [I reviewed images myself; the following is my interpretation. -VRP]  - superficial siderosis, cerebellar encephalomalacia, partially calcified lesion in the left cerebellum, moderate-severe ventriculomegaly - no acute findings  03/11/14 CT head [I reviewed images myself and agree with interpretation. No change from CT on 01/27/14. -VRP]  - Evidence of occipital craniotomy noted. Cerebellar atrophy re- demonstrated. Stable degree of mild ventricular dilatation when compared to the dissimilar prior exam. No acute hemorrhage, infarct, or mass lesion is identified. No midline shift. Ventricles are normal in size. Orbits and paranasal sinuses are unremarkable. No skull fracture. Bandage material is identified over the nose. Allowing for technique, no displaced nasal bone fracture is identified. Orbits are grossly unremarkable.   03/11/14 CT cervical spine - C1 through the cervicothoracic  junction is visualized in its entirety. Normal alignment. No precervical soft tissue widening. Left apical pleural thickening partly visualized. Minimal disc degenerative change noted at C6-C7. No fracture or dislocation.   03/11/14 MRI brain [I reviewed images myself and agree with interpretation. There is moderate-severe ventriculomegaly, slightly increased compared to MRI from 12/17/10. -VRP]  1. No acute intracranial abnormality identified. 2. Unchanged, extensive superficial siderosis. Unchanged cerebellar encephalomalacia and partially calcified lesion in the left cerebellar hemisphere.   10/14/14 MRI brain [I reviewed images myself and agree with interpretation. I think that ventriculomegaly is slightly increased compared to MRI on 03/11/14. -VRP]  1. Advanced superficial hemosiderosis.  2. 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  3. Stable calcified inferior left cerebellar mass, the presumedoperative target from remote suboccipital craniectomy.  12/02/14 CTA head  1. No intracranial aneurysm or high-flow vascular malformation. There is a small venous vessel in proximity to the chronic calcified inferior left cerebellar calcified lesion, but this does not resemble a DVA. Still, the possibility that the cerebellar lesion is a superficial cavernous malformation remains. 2. Stable CT appearance of the brain. No acute intracranial abnormality.  11/28/14 LP (opening pressure 12cm H2O) - CSF: WBC 0, RBC 435, protein 79, glucose 61, cryptococcal antigen negative, fungal stain and culture negative      ASSESSMENT AND PLAN  70 y.o. year old female here with complex medical and neurologic history dating back to age 64 years old, when she had intracranial hemorrhage presumably from a cerebellar cavernous malformation. Patient underwent treatment with suboccipital decompressive craniectomy, hematoma evacuation, as well as burr hole placement for  management of intracranial pressure. Patient had a very good recovery for most of her adult life. She has had worsening symptoms over the past few years with increasing gait difficulty, intermittent episodes of aphasia, and now progressively worsening balance and hearing loss. I reviewed extensive records, imaging and outside notes.  I agree that patient's recent attacks of aphasia, slurred speech, muscle jerks, falls are most likely partial onset seizures arising from the left temporal lobe. Symptoms seem to be controlled with antiseizure medication.  Regarding patient's progressively worsening balance, this is likely multifactorial from prior cerebellar injury, superficial siderosis, deconditioning and possible fungal meningitis. In addition recent MRI scan on 10/14/14 showed slight increase in ventriculomegaly compared to prior study in every 2016. This also has slightly progressed since 2012 MRI. Therefore this may represent a secondary neurodegenerative process versus slowly progressive hydrocephalus related to fungal meningitis  infection. 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  CT angiogram of the head --> chronic left cerebellar cavernous malformation, likely the cause of superficial siderosis, which can be managed more conservatively.   In terms of the change in ventricle size, may repeat imaging in future to see if this is progressively worsening and has a specific trajectory.     Dx:  Partial symptomatic epilepsy with complex partial seizures, not intractable, without status epilepticus (HCC)  Superficial siderosis of central nervous system  Hydrocephalus, communicating  Cryptococcal meningitis (HCC)  Gait difficulty    PLAN: - continue levetiracetam 500mg  twice a day  Meds ordered this encounter  Medications  . levETIRAcetam (KEPPRA) 500 MG tablet    Sig: Take 1 tablet (500 mg total) by mouth 2  (two) times daily.    Dispense:  180 tablet    Refill:  4   Return in about 1 year (around 07/27/2016).    Suanne MarkerVIKRAM R. PENUMALLI, MD 07/28/2015, 8:52 AM Certified in Neurology, Neurophysiology and Neuroimaging  [facility] [address], [address] [place], [place] [address] [phone]

## 2015-07-28 NOTE — Patient Instructions (Addendum)
Continue current medications. 

## 2016-02-19 ENCOUNTER — Encounter: Payer: Self-pay | Admitting: Endocrinology

## 2016-04-10 ENCOUNTER — Encounter: Payer: Self-pay | Admitting: Internal Medicine

## 2016-04-10 ENCOUNTER — Ambulatory Visit (INDEPENDENT_AMBULATORY_CARE_PROVIDER_SITE_OTHER): Payer: Medicare Other | Admitting: Internal Medicine

## 2016-04-10 VITALS — BP 120/84 | HR 84 | Ht 59.5 in | Wt 102.0 lb

## 2016-04-10 DIAGNOSIS — M810 Age-related osteoporosis without current pathological fracture: Secondary | ICD-10-CM

## 2016-04-10 LAB — VITAMIN D 25 HYDROXY (VIT D DEFICIENCY, FRACTURES): VITD: 37.75 ng/mL (ref 30.00–100.00)

## 2016-04-10 NOTE — Patient Instructions (Addendum)
Please stop at the lab. I will let you know if we need to supplement your vitamin D.  I would recommend to start Prolia after your dental work.   Make sure you get ~ 40g protein per day, 1200 mg calcium per day.  Return to see me as needed.  How Can I Prevent Falls? Men and women with osteoporosis need to take care not to fall down. Falls can break bones. Some reasons people fall are: Poor vision  Poor balance  Certain diseases that affect how you walk  Some types of medicine, such as sleeping pills.  Some tips to help prevent falls outdoors are: Use a cane or walker  Wear rubber-soled shoes so you don't slip  Walk on grass when sidewalks are slippery  In winter, put salt or kitty litter on icy sidewalks.  Some ways to help prevent falls indoors are: Keep rooms free of clutter, especially on floors  Use plastic or carpet runners on slippery floors  Wear low-heeled shoes that provide good support  Do not walk in socks, stockings, or slippers  Be sure carpets and area rugs have skid-proof backs or are tacked to the floor  Be sure stairs are well lit and have rails on both sides  Put grab bars on bathroom walls near tub, shower, and toilet  Use a rubber bath mat in the shower or tub  Keep a flashlight next to your bed  Use a sturdy step stool with a handrail and wide steps  Add more lights in rooms (and night lights) Buy a cordless phone to keep with you so that you don't have to rush to the phone       when it rings and so that you can call for help if you fall.   (adapted from http://www.niams.NightlifePreviews.se)  Exercise for Strong Bones (from [place]) There are two types of exercises that are important for building and maintaining bone density:  weight-bearing and muscle-strengthening exercises. Weight-bearing Exercises These exercises include activities that make you move against gravity while staying upright.  Weight-bearing exercises can be high-impact or low-impact. High-impact weight-bearing exercises help build bones and keep them strong. If you have broken a bone due to osteoporosis or are at risk of breaking a bone, you may need to avoid high-impact exercises. If you're not sure, you should check with your healthcare provider. Examples of high-impact weight-bearing exercises are: . Dancing . Doing high-impact aerobics . Hiking . Jogging/running . Jumping Rope . Stair climbing . Tennis Low-impact weight-bearing exercises can also help keep bones strong and are a safe alternative if you cannot do high-impact exercises. Examples of low-impact weight-bearing exercises are: . Using elliptical training machines . Doing low-impact aerobics . Using stair-step machines . Fast walking on a treadmill or outside Muscle-Strengthening Exercises These exercises include activities where you move your body, a weight or some other resistance against gravity. They are also known as resistance exercises and include: . Lifting weights . Using elastic exercise bands . Using weight machines . Lifting your own body weight . Functional movements, such as standing and rising up on your toes Yoga and Pilates can also improve strength, balance and flexibility. However, certain positions may not be safe for people with osteoporosis or those at increased risk of broken bones. For example, exercises that have you bend forward may increase the chance of breaking a bone in the spine. A physical therapist should be able to help you learn which exercises are safe and appropriate  for you. Non-Impact Exercises Non-impact exercises can help you to improve balance, posture and how well you move in everyday activities. These exercises can also help to increase muscle strength and decrease the risk of falls and broken bones. Some of these exercises include: . Balance exercises that strengthen your legs and test your balance, such  as Tai Chi, can decrease your risk of falls. . Posture exercises that improve your posture and reduce rounded or "sloping" shoulders can help you decrease the chance of breaking a bone, especially in the spine. . Functional exercises that improve how well you move can help you with everyday activities and decrease your chance of falling and breaking a bone. For example, if you have trouble getting up from a chair or climbing stairs, you should do these activities as exercises. A physical therapist can teach you balance, posture and functional exercises. Starting a New Exercise Program If you haven't exercised regularly for a while, check with your healthcare provider before beginning a new exercise program-particularly if you have health problems such as heart disease, diabetes or high blood pressure. If you're at high risk of breaking a bone, you should work with a physical therapist to develop a safe exercise program. Once you have your healthcare provider's approval, start slowly. If you've already broken bones in the spine because of osteoporosis, be very careful to avoid activities that require reaching down, bending forward, rapid twisting motions, heavy lifting and those that increase your chance of a fall. As you get started, your muscles may feel sore for a day or two after you exercise. If soreness lasts longer, you may be working too hard and need to ease up. Exercises should be done in a pain-free range of motion. How Much Exercise Do You Need? Weight-bearing exercises 30 minutes on most days of the week. Do a 30-minutesession or multiple sessions spread out throughout the day. The benefits to your bones are the same.   Muscle-strengthening exercises Two to three days per week. If you don't have much time for strengthening/resistance training, do small amounts at a time. You can do just one body part each day. For example do arms one day, legs the next and trunk the next. You can also spread  these exercises out during your normal day.  Balance, posture and functional exercises Every day or as often as needed. You may want to focus on one area more than the others. If you have fallen or lose your balance, spend time doing balance exercises. If you are getting rounded shoulders, work more on posture exercises. If you have trouble climbing stairs or getting up from the couch, do more functional exercises. You can also perform these exercises at one time or spread them during your day. Work with a phyiscal therapist to learn the right exercises for you.

## 2016-04-10 NOTE — Progress Notes (Signed)
Patient ID: Claudia KNEECE, female   DOB: [birth year], 71 y.o.   MRN: [id number]    HPI  Claudia Castaneda is a 71 y.o.-year-old female, referred by her PCP, Dr. Radene Ou, for management of osteoporosis. Caregiver accompanies her today >> she is telling me she will move to [place] in 05/2016.  Pt was dx with OP in 2017.  I reviewed pt's DEXA scans: Date L1-L4 T score FN T score  11/22/2015 -3.5 RFN: -2.6 LFN: - 2.5  04/24/2004 -1.7 RFN: -1.3 LFN: -1.1   She denies fractures or falls. 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 She also has a history of epilepsy and severe ulcerative colitis.  She has frequent falls.  No previous OP treatments.  No h/o vitamin D deficiency. No available vit D levels: No results found for: VD25OH  Pt was on calcium 600 mg >> now stopped. She is on vitamin D 1000 units. She also eats dairy (cottage cheese) and green, leafy, vegetables.   + some weight bearing exercises - every day at home.  She does not take high vitamin A doses.  Menopause was at 71 y/o.   Pt does have a FH of osteoporosis: mother with wrist fx in her 65s.  No h/o hyper/hypocalcemia or hyperparathyroidism. No h/o kidney stones. 11/11/2015: corrected Ca 9.4 Lab Results  Component Value Date   CALCIUM 8.4 03/12/2014   CALCIUM 9.5 03/11/2014   No h/o thyrotoxicosis. Reviewed TSH recent levels:  10/07/20217: TSH 2.59 Lab Results  Component Value Date   TSH 2.790 11/11/2014   TSH 3.724 03/12/2014   No h/o CKD. Last BUN/Cr: 10/07/20217:  15/0.69 (eGFR 84) Lab Results  Component Value Date   BUN 9 03/12/2014   CREATININE 0.7 12/02/2014   ROS: Constitutional: + weight loss, + fatigue, no subjective hyperthermia/hypothermia, + poor sleep, + nocturia Eyes: no blurry vision, no xerophthalmia ENT: no sore throat, no nodules palpated in throat, no dysphagia/odynophagia, no hoarseness, + hypoacusis (her caregiver helps her with this   today) Cardiovascular: + CP/no SOB/palpitations/leg swelling Respiratory: no cough/SOB Gastrointestinal: + N/no V/+ D/no C Musculoskeletal: no muscle/joint aches Skin: no rashes, + easy bruising Neurological: no tremors/numbness/tingling/dizziness Psychiatric: + depression/no anxiety  Past Medical History:  Diagnosis Date  . Brain aneurysm   . Memory loss    short term  . NPH (normal pressure hydrocephalus)   . Seizures ([place])    last sz 03/2014  . Ulcerative colitis [facility])    Past Surgical History:  Procedure Laterality Date  . BRAIN SURGERY     Social History   Social History  . Marital status: Widowed    Spouse name: N/A  . Number of children: 1   Occupational History  . retired   Social History Main Topics  . Smoking status: Never Smoker  . Smokeless tobacco: Never Used  . Alcohol use No  . Drug use: No   Current Outpatient Prescriptions on File Prior to Visit  Medication Sig Dispense Refill  . acetaminophen (TYLENOL) 325 MG tablet Take 325 mg by mouth every 6 (six) hours as needed.     Marland Kitchen CALCIUM PO Take 1 tablet by mouth daily.    . Cholecalciferol (D3 ADULT PO) Take 1 tablet by mouth daily.    . hydrocortisone (CORTENEMA) 100 MG/60ML enema USE 1 RECTALLY QHS  1  . hyoscyamine (ANASPAZ) 0.125 MG TBDP disintergrating tablet 0.125 mg. Take 1-2 tabs every 4-6 hrs as needed for cramps  2  .  hyoscyamine (LEVBID) 0.375 MG 12 hr tablet 0.375 mg at bedtime.  3  . levETIRAcetam (KEPPRA) 500 MG tablet Take 1 tablet (500 mg total) by mouth 2 (two) times daily. 180 tablet 4  . Mesalamine (DELZICOL) 400 MG CPDR DR capsule Take 400 mg by mouth 4 (four) times daily.    . Multiple Vitamin (MULTIVITAMIN WITH MINERALS) TABS tablet Take 1 tablet by mouth daily.    . polyvinyl alcohol (LIQUIFILM TEARS) 1.4 % ophthalmic solution Place 1 drop into both eyes 2 (two) times daily as needed for dry eyes.    . Probiotic Product (PROBIOTIC ADVANCED PO) Take by mouth. Takes 4 a day    .  UCERIS 9 MG TB24 9 mg daily.    Marland Kitchen UNABLE TO FIND 99 mg. Med Name:potassium 99     No current facility-administered medications on file prior to visit.    Allergies  Allergen Reactions  . Pancreatin Rash    Pt not positive of the name of med that was given to her in the hospital when she had a fungus in here spinal fluid   Family History  Problem Relation Age of Onset  . Stroke Mother   . Hypertension Mother   . CAD Father    PE: BP 120/84 (BP Location: Left Arm, Patient Position: Sitting)   Pulse 84   Ht 4' 11.5" (1.511 m)   Wt 102 lb (46.3 kg)   SpO2 98%   BMI 20.26 kg/m  Wt Readings from Last 3 Encounters:  04/10/16 102 lb (46.3 kg)  07/28/15 105 lb 6.4 oz (47.8 kg)  12/15/14 105 lb 6.4 oz (47.8 kg)   Constitutional: thin, in NAD. No kyphosis. Frail appearing. Walks with walker. Eyes: PERRLA, EOMI, no exophthalmos ENT: moist mucous membranes, no thyromegaly, no cervical lymphadenopathy Cardiovascular: RRR, No MRG Respiratory: CTA B Gastrointestinal: abdomen soft, NT, ND, BS+ Musculoskeletal: no deformities, strength decreased in all 4 Skin: moist, warm, no rashes Neurological: no tremor with outstretched hands but truncal + head tremor, DTR normal in all 4  Assessment: 1. Osteoporosis  Plan: 1. Osteoporosis - likely postmenopausal - she has FH of OP - Discussed with pt and her caregiver about increased risk of fracture, depending on the T score, greatly increased when the T score is lower than -2.5, but it is actually a continuum and -2.5 should not be regarded as an absolute threshold. We reviewed her DEXA scan reports from 2006 and 2017 together, and I explained that based on the T scores, she has had a decrease in BMD in these last 11 years, which is associated with an increased risk for fractures. She has had many falls, and luckily she did not of any fracture so far. However, with such a low T-scores, I explained that this may not continue for long. - we reviewed  her dietary and supplemental calcium and vitamin D intake. I recommended to make sure she gets 1000-1200 mg of calcium daily  (she takes a multivitamin) and I will check vit D today to see if she needs supplementation  - discussed fall precautions with patient and her care - given handout from [facility]: weight bearing exercises - advised to do this every day or at least 5/7 days - we discussed about maintaining a good amount of protein in her diet. The recommended daily protein intake is ~0.8 g per kilogram per day (For her, approximately 40 g a day). I advised her to try to aim for this amount,  since a diet low in proteins can exacerbate osteoporosis.She is not smoking or drinking alcohol. - We discussed about the different medication classes, benefits and side effects (including atypical fractures and ONJ -she does havel workup planned >> I strongly advised her to finish this and then start medication for osteoporosis. - I explained that my first choicefor her osteoporosis treatment  would be sq denosumab (Prolia) for 3-6 years, then zoledronic acid (iv Reclast) for 1-2 years. I would use Teriparatide as a last resort.  - Patient will be moving to [place] next month, therefore, for now, I advised her to focus on finishing her dental work (dental implants) as soon as possible, but I suggested to discuss treatment with her physician there. I did write down my suggestions for her. Also, we need to make sure that her vitamin D level is repleted before she should start any osteoporosis medication, otherwise, will develop generalized muscle and joint pain with treatment. Patient and caregiver agree with the plan.  Orders Placed This Encounter  Procedures  . Vitamin D, 25-hydroxy   - time spent with the patient: 1 hour, of which >50% was spent in obtaining information about her symptoms, reviewing her previous labs, evaluations, and treatments, counseling her about her condition  (please see the discussed topics above), and developing a plan to further investigate it.  Office Visit on 04/10/2016  Component Date Value Ref Range Status  . VITD 04/10/2016 37.75  30.00 - 100.00 ng/mL Final   Vitamin D level is normal, however. She can continue with her current dose of vitamin D.  Philemon Kingdom, MD PhD [facility] Endocrinology

## 2016-04-11 DIAGNOSIS — M810 Age-related osteoporosis without current pathological fracture: Secondary | ICD-10-CM | POA: Insufficient documentation

## 2016-04-12 ENCOUNTER — Telehealth: Payer: Self-pay

## 2016-04-12 NOTE — Telephone Encounter (Signed)
-----   Message from Carlus Pavlovristina Gherghe, MD sent at 04/11/2016  5:37 PM EST ----- Raynelle FanningJulie, can you please call pt: Vitamin D level is normal. She can continue with her current dose of vitamin D.

## 2016-04-12 NOTE — Telephone Encounter (Signed)
Called patient and attempted to speak with her, patient is hard of hearing, she gave me the okay to talk to her nephew, I gave him lab results, and advised to stay on the same dose of medication. He understood and would tell her these results.

## 2016-04-18 ENCOUNTER — Ambulatory Visit: Payer: PRIVATE HEALTH INSURANCE | Admitting: Family

## 2016-04-19 ENCOUNTER — Ambulatory Visit: Payer: PRIVATE HEALTH INSURANCE | Admitting: Family

## 2016-06-11 ENCOUNTER — Ambulatory Visit (INDEPENDENT_AMBULATORY_CARE_PROVIDER_SITE_OTHER): Payer: Medicare Other

## 2016-06-11 ENCOUNTER — Encounter (HOSPITAL_COMMUNITY): Payer: Self-pay | Admitting: Emergency Medicine

## 2016-06-11 ENCOUNTER — Ambulatory Visit (HOSPITAL_COMMUNITY)
Admission: EM | Admit: 2016-06-11 | Discharge: 2016-06-11 | Disposition: A | Discharge status: Home / Self Care | Payer: Medicare Other | Attending: Emergency Medicine | Admitting: Emergency Medicine

## 2016-06-11 DIAGNOSIS — S2231XA Fracture of one rib, right side, initial encounter for closed fracture: Secondary | ICD-10-CM | POA: Diagnosis not present

## 2016-06-11 DIAGNOSIS — M545 Low back pain: Secondary | ICD-10-CM | POA: Diagnosis not present

## 2016-06-11 DIAGNOSIS — W19XXXA Unspecified fall, initial encounter: Secondary | ICD-10-CM | POA: Diagnosis not present

## 2016-06-11 DIAGNOSIS — S2241XA Multiple fractures of ribs, right side, initial encounter for closed fracture: Secondary | ICD-10-CM | POA: Diagnosis not present

## 2016-06-11 DIAGNOSIS — M546 Pain in thoracic spine: Secondary | ICD-10-CM

## 2016-06-11 MED ORDER — LIDOCAINE 5 % EX PTCH: 1.0000 | MEDICATED_PATCH | CUTANEOUS | 0 refills | Status: AC

## 2016-06-11 NOTE — ED Triage Notes (Signed)
[facility] with a complaint of lower back pain on the right side secondary to a fall that occurred in the bath tub 4 days ago.

## 2016-06-11 NOTE — Discharge Instructions (Addendum)
You have 2 broken ribs 10/11; Rest,lidoderm patch as directed. Follow up with PCP for recheck in 2 days, sooner if worse. Go to Er if pt develops shortness of breath or worsening issues

## 2016-06-11 NOTE — ED Provider Notes (Signed)
CSN: [id number]     Arrival date & time 06/11/16  1732 History   None    Chief Complaint  Patient presents with  . Fall   (Consider location/radiation/quality/duration/timing/severity/associated sxs/prior Treatment) The history is provided by the patient and a caregiver. No language interpreter was used.  Fall  This is a new problem. The current episode started 2 days ago. The problem occurs constantly. The problem has not changed since onset.Pertinent negatives include no chest pain, no abdominal pain and no headaches. Exacerbated by: movement, deep breathing. Nothing relieves the symptoms. Treatments tried: topical OTC ointment. The treatment provided mild relief.    Past Medical History:  Diagnosis Date  . Brain aneurysm   . Memory loss    short term  . NPH (normal pressure hydrocephalus)   . Seizures ([facility])    last sz 03/2014  . Ulcerative colitis [facility](HCC)    Past Surgical History:  Procedure Laterality Date  . BRAIN SURGERY     Family History  Problem Relation Age of Onset  . Stroke Mother   . Hypertension Mother   . CAD Father    Social History  Substance Use Topics  . Smoking status: Never Smoker  . Smokeless tobacco: Never Used  . Alcohol use No   OB History    No data available     Review of Systems  Cardiovascular: Negative for chest pain.  Gastrointestinal: Negative for abdominal pain.  Neurological: Negative for headaches.  All other systems reviewed and are negative.   Allergies  Pancreatin  Home Medications   Prior to Admission medications   Medication Sig Start Date End Date Taking? Authorizing Provider  acetaminophen (TYLENOL) 325 MG tablet Take 325 mg by mouth every 6 (six) hours as needed.     [provider]  CALCIUM PO Take 1 tablet by mouth daily.    [provider]  Cholecalciferol (D3 ADULT PO) Take 1 tablet by mouth daily.    [provider]  hydrocortisone (CORTENEMA) 100 MG/60ML enema USE 1 RECTALLY QHS 07/19/15    [provider]  hyoscyamine (ANASPAZ) 0.125 MG TBDP disintergrating tablet 0.125 mg. Take 1-2 tabs every 4-6 hrs as needed for cramps 06/12/15   [provider]  hyoscyamine (LEVBID) 0.375 MG 12 hr tablet 0.375 mg at bedtime. 06/12/15   [provider]  levETIRAcetam (KEPPRA) 500 MG tablet Take 1 tablet (500 mg total) by mouth 2 (two) times daily. 07/28/15   Penumalli, Glenford BayleyVikram R, MD  lidocaine (LIDODERM) 5 % Place 1 patch onto the skin daily. Remove & Discard patch within 12 hours or as directed by MD 06/11/16   Teela Narducci, Para MarchJeanette, NP  Mesalamine (DELZICOL) 400 MG CPDR DR capsule Take 400 mg by mouth 4 (four) times daily.    [provider]  Multiple Vitamin (MULTIVITAMIN WITH MINERALS) TABS tablet Take 1 tablet by mouth daily.    [provider]  polyvinyl alcohol (LIQUIFILM TEARS) 1.4 % ophthalmic solution Place 1 drop into both eyes 2 (two) times daily as needed for dry eyes.    [provider]  Probiotic Product (PROBIOTIC ADVANCED PO) Take by mouth. Takes 4 a day    [provider]  UCERIS 9 MG TB24 9 mg daily. 07/18/15   [provider]  UNABLE TO FIND 99 mg. Med Name:potassium 99    [provider]   Meds Ordered and Administered this Visit  Medications - No data to display  BP 120/75 (BP Location: Left Arm)  Pulse 85   Temp 97.5 F (36.4 C) (Oral)   Resp 16   SpO2 99%  No data found.   Physical Exam  Constitutional: She is oriented to person, place, and time. She appears well-developed and well-nourished. She is active.  Non-toxic appearance. She does not have a sickly appearance. She does not appear ill. She appears distressed.  HENT:  Head: Normocephalic.  Right Ear: Tympanic membrane normal.  Left Ear: Tympanic membrane normal.  Nose: Nose normal.  Mouth/Throat: Uvula is midline and mucous membranes are normal.  Eyes: Pupils are equal, round, and reactive to light.  Neck: Trachea normal and normal  range of motion. Muscular tenderness present. No Brudzinski's sign and no Kernig's sign noted.  Cardiovascular: Normal rate and regular rhythm.   Pulmonary/Chest: Effort normal and breath sounds normal.  Musculoskeletal:       Right shoulder: She exhibits normal range of motion, no tenderness, no bony tenderness, no swelling, no effusion, no crepitus, no deformity, no laceration, no pain, normal pulse and normal strength.       Thoracic back: She exhibits tenderness. She exhibits normal range of motion and no bony tenderness.       Lumbar back: She exhibits tenderness.  +TTP of right lateral lower  rib cage  Neurological: She is alert and oriented to person, place, and time. GCS eye subscore is 4. GCS verbal subscore is 5. GCS motor subscore is 6.  Skin: Skin is warm and dry. No rash noted.  Psychiatric: She has a normal mood and affect. Her speech is normal and behavior is normal.  Nursing note and vitals reviewed.   Urgent Care Course     Procedures (including critical care time)  Labs Review Labs Reviewed - No data to display  Imaging Review Dg Ribs Unilateral W/chest Right  Result Date: 06/11/2016 CLINICAL DATA:  Patient fell in bathroom 2 days ago. Continued pain. Double EXAM: RIGHT RIBS AND CHEST - 3+ VIEW COMPARISON:  None. FINDINGS: There is a RIGHT tenth rib fracture, minimally displaced, posterolaterally. No other definite rib fractures. No pneumothorax or effusion. Normal cardiomediastinal silhouette. Clear lung fields. IMPRESSION: RIGHT tenth rib fracture, posterolateral, minimally displaced. Electronically Signed   By: Elsie Stain M.D.   On: 06/11/2016 19:41   Dg Thoracic Spine 2 View  Result Date: 06/11/2016 CLINICAL DATA:  Thoracic back pain after fall in the bathroom 2 days prior. EXAM: THORACIC SPINE 2 VIEWS COMPARISON:  None. FINDINGS: Mild dextroscoliotic thoracic curvature. Vertebral body heights are maintained. No significant disc space narrowing. Posterior elements  appear intact. No evidence of thoracic spine fracture. There is no paravertebral soft tissue abnormality. IMPRESSION: No fracture or subluxation of the thoracic spine. Electronically Signed   By: Rubye Oaks M.D.   On: 06/11/2016 19:45   Dg Lumbar Spine Complete  Result Date: 06/11/2016 CLINICAL DATA:  Fall in the bathroom 2 days prior. Thoracic and lumbar back pain. EXAM: LUMBAR SPINE - COMPLETE 4+ VIEW COMPARISON:  None. FINDINGS: Mild broad-based levo scoliotic curvature of the lumbar spine. Vertebral body heights are normal. There is no listhesis. The posterior elements are intact. Minimal disc space narrowing at L1-L2 with endplate spurring. No fracture. Sacroiliac joints are symmetric and normal. Round calcification in the left upper quadrant is of doubtful clinical significance. The bones appear under mineralized. Incidental notes of fractures of right lateral tenth and eleventh ribs. IMPRESSION: Mild scoliotic curvature and degenerative change. No acute fracture or subluxation. Right lower rib fractures, dedicated rib series was  performed concurrently. Electronically Signed   By: Rubye Oaks M.D.   On: 06/11/2016 19:44        MDM   1. Fall, initial encounter   2. Closed fracture of multiple ribs of right side, initial encounter     You have 2 broken ribs 10/11; Rest,lidoderm patch as directed. Follow up with PCP for recheck in 2 days, sooner if worse. Go to Er if pt develops shortness of breath or worsening issues. Pt verbalized understanding to this provider.    Clancy Gourd, NP 06/11/16 2152

## 2016-08-02 ENCOUNTER — Ambulatory Visit: Payer: Medicare Other | Admitting: Diagnostic Neuroimaging

## 2016-08-05 ENCOUNTER — Encounter: Payer: Self-pay | Admitting: Diagnostic Neuroimaging

## 2016-12-22 IMAGING — XA DG FLUORO GUIDE LUMBAR PUNCTURE
1 series · 2 of 2 positions shown · non-contrast
Comparison: none

CLINICAL DATA: Superficial siderosis. Partial complex seizures.
Question opportunistic infection with cryptococcal meningitis.
Communicating hydrocephalus.

[Series 1: ortho standard · 2 of 2 slices shown]
[im 1/2]
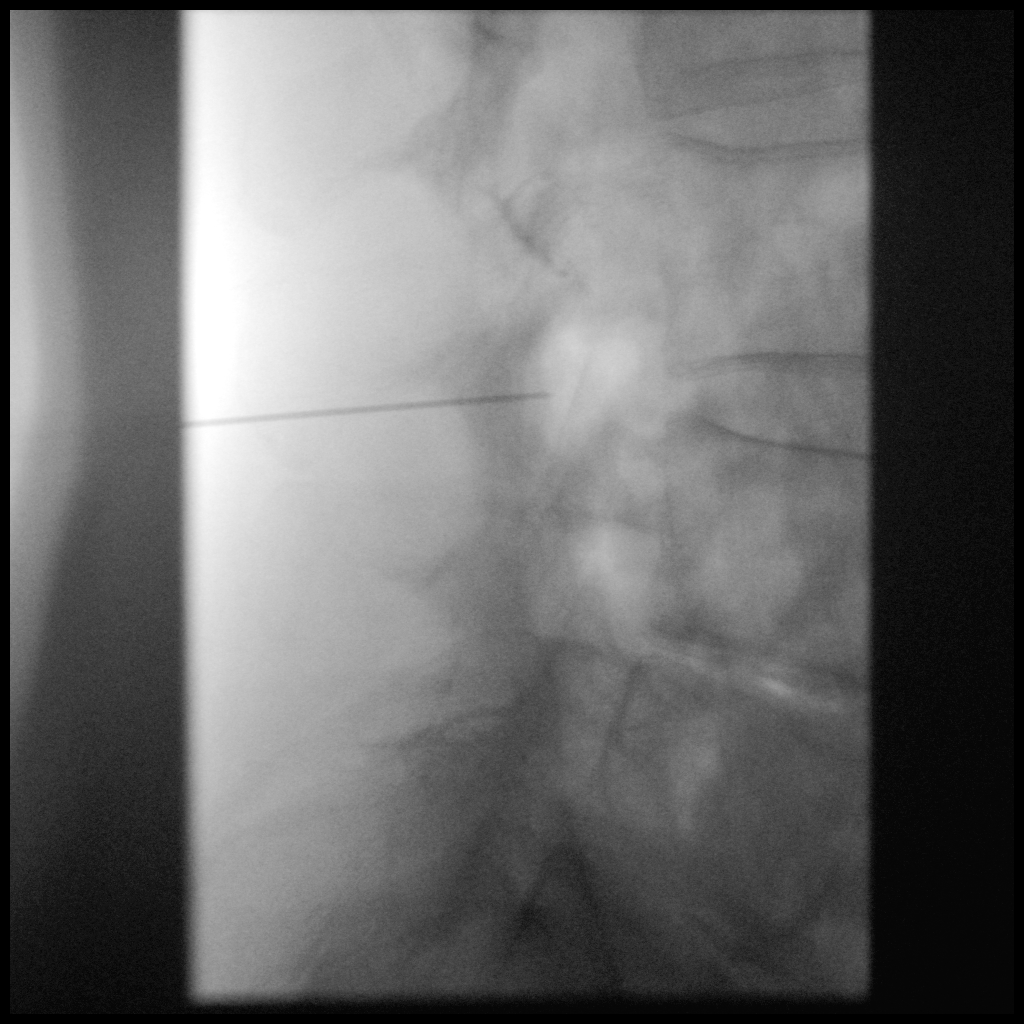
[im 2/2]
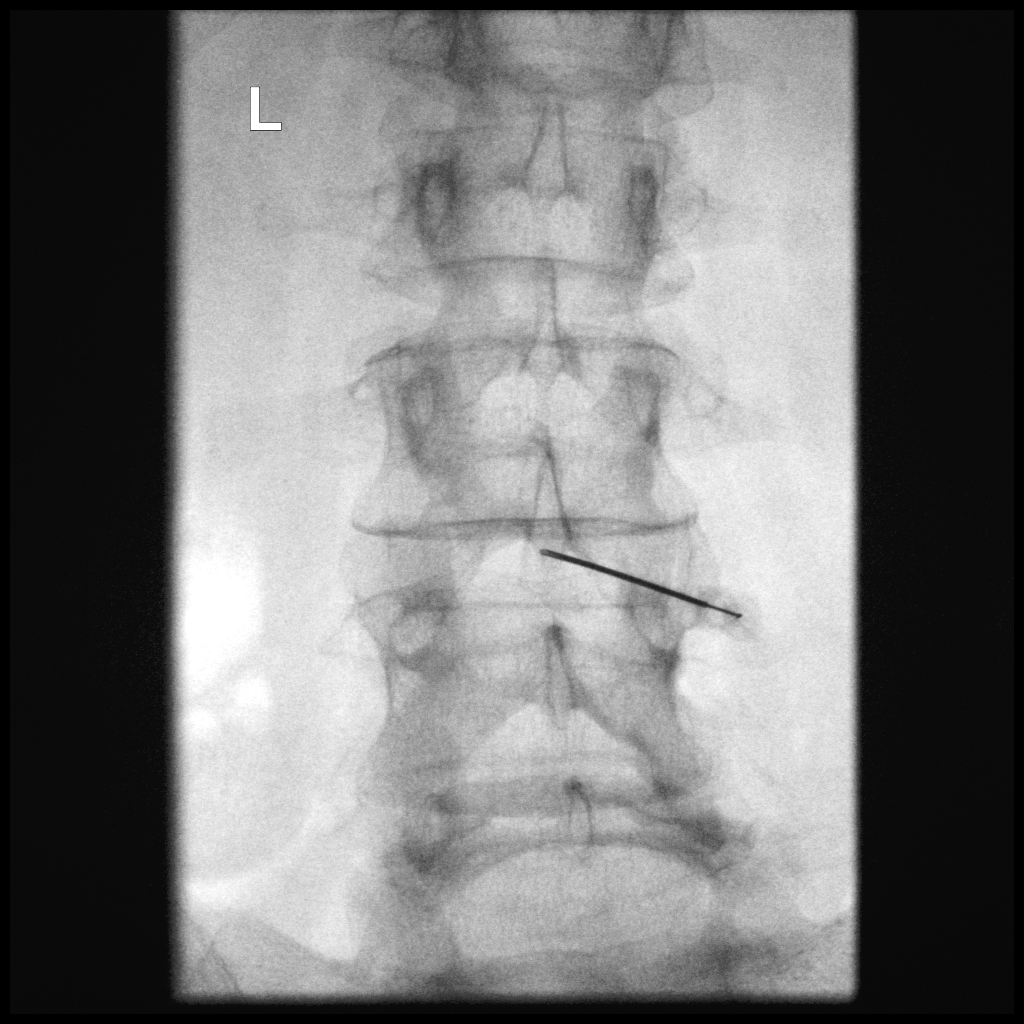

[2 of 2 positions shown; findings below may reference images not displayed]

EXAM:
DIAGNOSTIC LUMBAR PUNCTURE UNDER FLUOROSCOPIC GUIDANCE

FLUOROSCOPY TIME:  Radiation Exposure Index (as provided by the
fluoroscopic device): corresponding to a Dose Area Product of 26
?Gy*m2

If the device does not provide the exposure index:

Fluoroscopy Time (in minutes and seconds):  15 seconds

Number of Acquired Images:  2

PROCEDURE:
Informed consent was obtained from the patient prior to the
procedure, including potential complications of headache, allergy,
and pain. Time-out was performed.

With the patient prone, the lower back was prepped with Betadine. 1%
Lidocaine was used for local anesthesia. Lumbar puncture was
performed at the L3-L4 level using a 20 gauge needle with return of
clear CSF with an opening pressure of 12 cm water. Greater than 10
ml of CSF were obtained for laboratory studies. The patient
tolerated the procedure well and there were no apparent
complications. Instructions were given for bed rest for 24 hours
postprocedure.
IMPRESSION: Technically successful diagnostic lumbar puncture. Normal opening
pressure with clear fluid.

## 2018-07-06 IMAGING — DX DG LUMBAR SPINE COMPLETE 4+V
4 series · 4 of 4 positions shown · non-contrast
Comparison: None.

CLINICAL DATA: Fall in the bathroom 2 days prior. Thoracic and
lumbar back pain.

EXAM:
LUMBAR SPINE - COMPLETE 4+ VIEW

[l-spine ap]
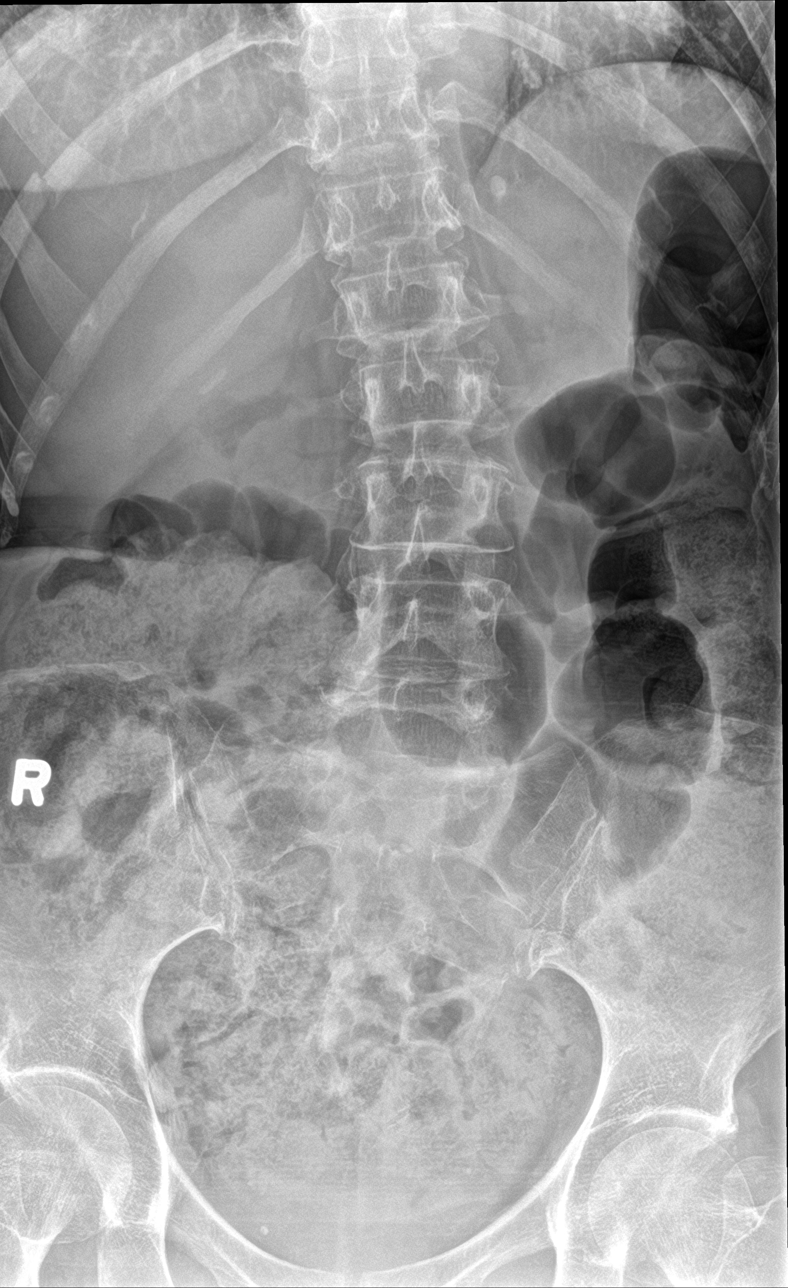

[l-spine obl (1 of 2)]
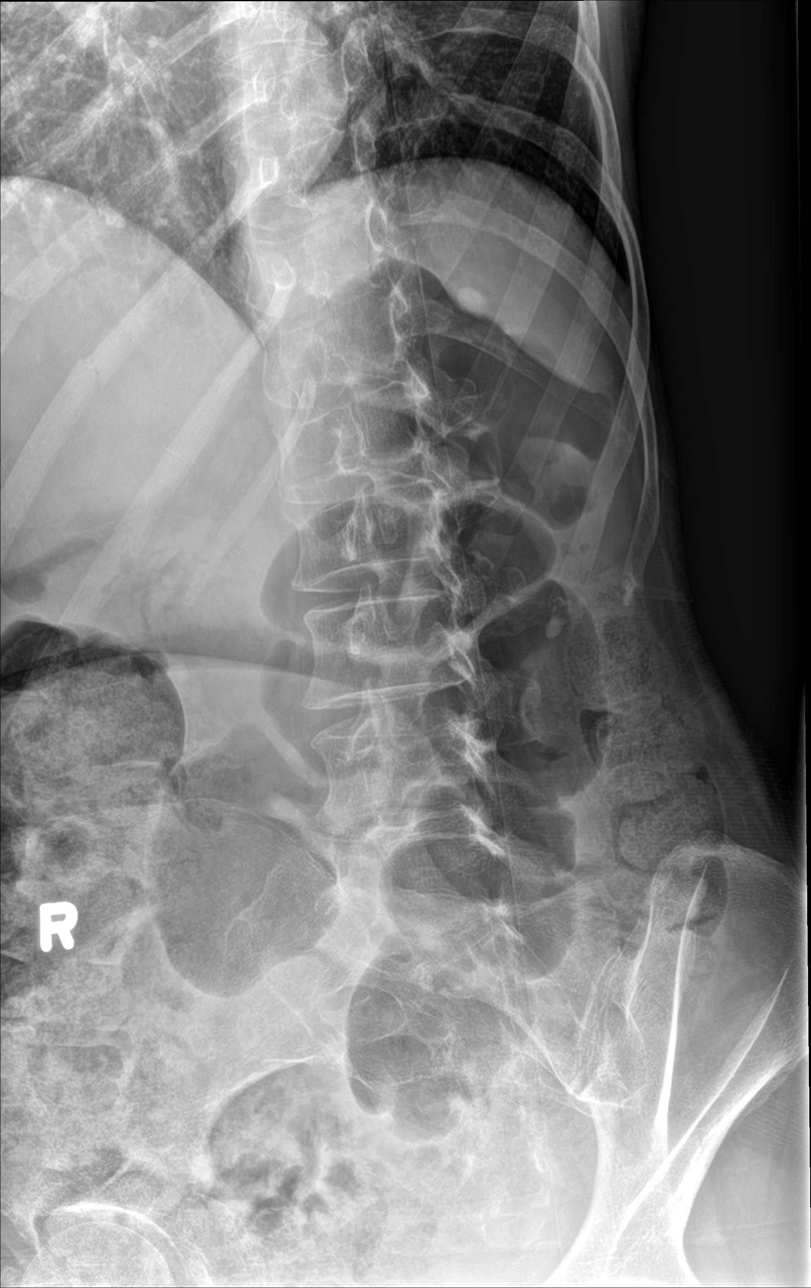

[l-spine obl (2 of 2)]
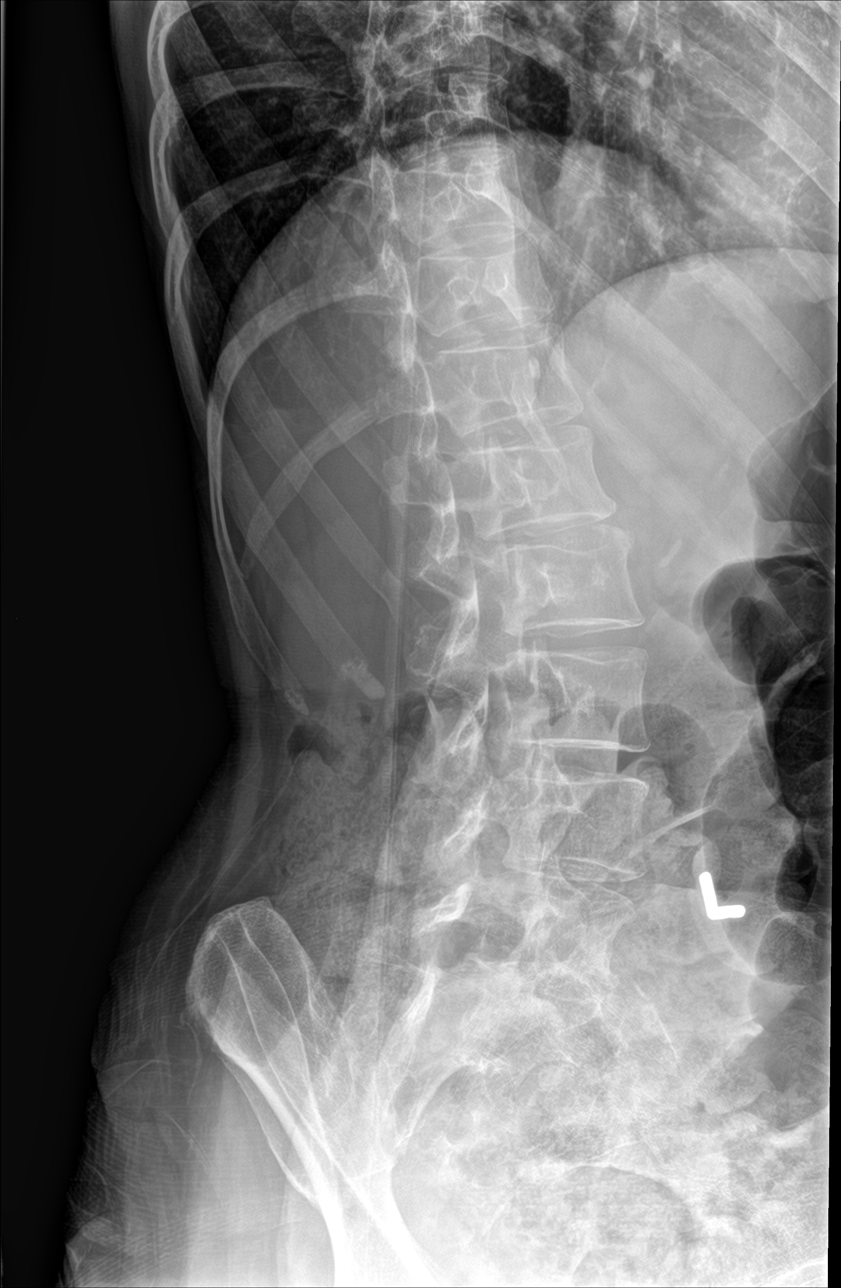

[l-spine lat]
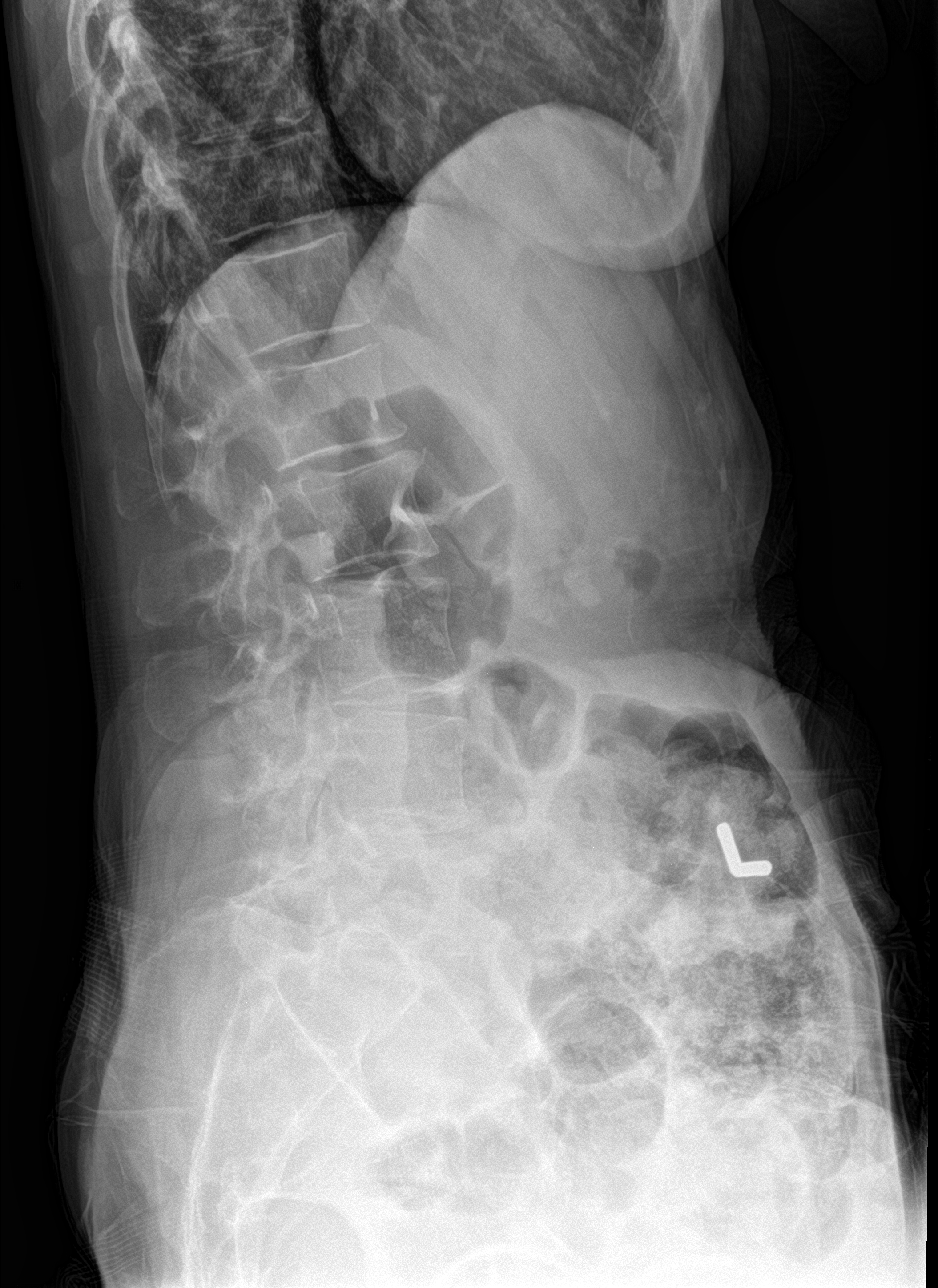

[4 of 4 positions shown; findings below may reference images not displayed]

FINDINGS: Mild broad-based levo scoliotic curvature of the lumbar spine.
Vertebral body heights are normal. There is no listhesis. The
posterior elements are intact. Minimal disc space narrowing at L1-L2
with endplate spurring. No fracture. Sacroiliac joints are symmetric
and normal. Round calcification in the left upper quadrant is of
doubtful clinical significance. The bones appear under mineralized.
Incidental notes of fractures of right lateral tenth and eleventh
ribs.
IMPRESSION: Mild scoliotic curvature and degenerative change. No acute fracture
or subluxation.

Right lower rib fractures, dedicated rib series was performed
concurrently.

## 2023-09-05 DEATH — deceased
